# Patient Record
Sex: Female | Born: 1970 | Race: Black or African American | Hispanic: No | Marital: Single | Smoking: Never smoker
Health system: Southern US, Community
[De-identification: ages and names within clinical notes are randomized; demographics above are authoritative.]

## PROBLEM LIST (undated history)

## (undated) DIAGNOSIS — R011 Cardiac murmur, unspecified: Secondary | ICD-10-CM

## (undated) DIAGNOSIS — R200 Anesthesia of skin: Secondary | ICD-10-CM

## (undated) DIAGNOSIS — M1990 Unspecified osteoarthritis, unspecified site: Secondary | ICD-10-CM

## (undated) DIAGNOSIS — D649 Anemia, unspecified: Secondary | ICD-10-CM

## (undated) DIAGNOSIS — R202 Paresthesia of skin: Secondary | ICD-10-CM

## (undated) HISTORY — PX: WISDOM TOOTH EXTRACTION: SHX21

---

## 1998-04-03 ENCOUNTER — Emergency Department (HOSPITAL_COMMUNITY): Admission: EM | Admit: 1998-04-03 | Discharge: 1998-04-03 | Payer: Self-pay | Admitting: Emergency Medicine

## 1999-12-28 ENCOUNTER — Emergency Department (HOSPITAL_COMMUNITY): Admission: EM | Admit: 1999-12-28 | Discharge: 1999-12-28 | Payer: Self-pay | Admitting: Emergency Medicine

## 2010-08-15 ENCOUNTER — Other Ambulatory Visit (HOSPITAL_COMMUNITY): Admission: RE | Admit: 2010-08-15 | Payer: BC Managed Care – PPO | Source: Ambulatory Visit | Admitting: Family Medicine

## 2010-08-15 DIAGNOSIS — Z01419 Encounter for gynecological examination (general) (routine) without abnormal findings: Secondary | ICD-10-CM | POA: Insufficient documentation

## 2011-08-19 ENCOUNTER — Other Ambulatory Visit (HOSPITAL_COMMUNITY)
Admission: RE | Admit: 2011-08-19 | Discharge: 2011-08-19 | Disposition: A | Discharge status: Home / Self Care | Payer: BC Managed Care – PPO | Source: Ambulatory Visit | Attending: Family Medicine | Admitting: Family Medicine

## 2011-08-19 DIAGNOSIS — Z124 Encounter for screening for malignant neoplasm of cervix: Secondary | ICD-10-CM | POA: Insufficient documentation

## 2012-09-14 ENCOUNTER — Other Ambulatory Visit (HOSPITAL_COMMUNITY)
Admission: RE | Admit: 2012-09-14 | Discharge: 2012-09-14 | Disposition: A | Discharge status: Home / Self Care | Payer: BC Managed Care – PPO | Source: Ambulatory Visit | Attending: Family Medicine | Admitting: Family Medicine

## 2012-09-14 ENCOUNTER — Other Ambulatory Visit: Payer: Self-pay | Admitting: Physician Assistant

## 2012-09-14 DIAGNOSIS — Z124 Encounter for screening for malignant neoplasm of cervix: Secondary | ICD-10-CM | POA: Insufficient documentation

## 2013-09-15 ENCOUNTER — Other Ambulatory Visit (HOSPITAL_COMMUNITY)
Admission: RE | Admit: 2013-09-15 | Discharge: 2013-09-15 | Disposition: A | Discharge status: Home / Self Care | Payer: BC Managed Care – PPO | Source: Ambulatory Visit | Attending: Family Medicine | Admitting: Family Medicine

## 2013-09-15 ENCOUNTER — Other Ambulatory Visit: Payer: Self-pay | Admitting: Physician Assistant

## 2013-09-15 DIAGNOSIS — Z124 Encounter for screening for malignant neoplasm of cervix: Secondary | ICD-10-CM | POA: Insufficient documentation

## 2015-02-15 ENCOUNTER — Other Ambulatory Visit: Payer: Self-pay | Admitting: Radiology

## 2015-09-25 ENCOUNTER — Other Ambulatory Visit: Payer: Self-pay | Admitting: Physician Assistant

## 2015-09-25 ENCOUNTER — Other Ambulatory Visit (HOSPITAL_COMMUNITY)
Admission: RE | Admit: 2015-09-25 | Discharge: 2015-09-25 | Disposition: A | Discharge status: Home / Self Care | Payer: BC Managed Care – PPO | Source: Ambulatory Visit | Attending: Family Medicine | Admitting: Family Medicine

## 2015-09-25 DIAGNOSIS — Z124 Encounter for screening for malignant neoplasm of cervix: Secondary | ICD-10-CM | POA: Insufficient documentation

## 2015-09-26 LAB — CYTOLOGY - PAP

## 2016-08-11 ENCOUNTER — Other Ambulatory Visit: Payer: Self-pay | Admitting: Physician Assistant

## 2016-08-11 DIAGNOSIS — R109 Unspecified abdominal pain: Secondary | ICD-10-CM

## 2016-08-13 ENCOUNTER — Ambulatory Visit
Admission: RE | Admit: 2016-08-13 | Discharge: 2016-08-13 | Disposition: A | Discharge status: Home / Self Care | Payer: BC Managed Care – PPO | Source: Ambulatory Visit | Attending: Physician Assistant | Admitting: Physician Assistant

## 2016-08-13 DIAGNOSIS — R109 Unspecified abdominal pain: Secondary | ICD-10-CM

## 2016-08-13 MED ORDER — IOPAMIDOL (ISOVUE-300) INJECTION 61%
100.0000 mL | Freq: Once | INTRAVENOUS | Status: AC | PRN
  Administered 2016-08-13: 100 mL via INTRAVENOUS

## 2016-08-18 NOTE — H&P (Signed)
46yo G2P2 who presents for removal of perforated IUD by hysteroscopy, possibl laparoscopy.  In review, she had the device placed on 10/25/2015.  At that time, the patient did have some significant pelvic pain; however, it seemed to resolve by her string check the following few weeks and an US and showed that the device appeared in the proper position. Since that time she has had intermittent pain, but this past week she noted worsening pain on her right side. Pain is sharp and worse when sitting for long periods of time. She has tried OTC medication with minimal improvement, but rather the pain has not completely resolved. She has noted some nausea, no vomiting. With device, she had not had a period and denies any abnormal bleeding. Denies abnormal discharge, itching or irritation.  Pt was then seen by her PCP who performed a CT, which shows that a portion of the IUD arm is outside of the uterine cavity.      ROS:  CONSTITUTIONAL:  no Chills. no Fever. no Night sweats.  HEENT:  Blurrred vision no. no Double vision.  CARDIOLOGY:  no Chest pain.  RESPIRATORY:  no Shortness of breath. no Cough.  UROLOGY:  no Urinary frequency. no Urinary incontinence. no Urinary urgency.  GASTROENTEROLOGY:  Abdominal pain yes, see HPI. no Appetite change. no Change in bowel movements.  FEMALE REPRODUCTIVE:  no Breast lumps or discharge. no Breast pain.  NEUROLOGY:  no Dizziness. no Headache. no Loss of consciousness.  PSYCHOLOGY:  no Anxiety. no Depression.  SKIN:  no Rash. no Hives.  HEMATOLOGY/LYMPH:  no Anemia. no Fatigue. Using Blood Thinners no.         Medical History: none       Gyn History:  Sexual activity currently sexually active.  Periods : irregular.  Birth control Mirena IUD placed 10/18/15.  Last pap smear date 09/25/15 - WNL.  Last mammogram date 02/15/15, 08/08/15 breast u/s.  Denies H/O Abnormal pap smear.  Denies H/O STD.        OB History:  Number of pregnancies 2.  Pregnancy #  1 live birth, vaginal delivery.  Pregnancy # 2 live birth, vaginal delivery.        Surgical History: Denies Past Surgical History.        Hospitalization/Major Diagnostic Procedure: Childbirth x 2 via vaginal .        Family History: Father: alive. Mother: alive 6264 yrs, diagnosed with Hypertension. Paternal Grand Father: deceased. Paternal Grand Mother: alive. Maternal Grand Father: deceased. Maternal Grand Mother: deceased, AODM. Brother 1: alive. Son(s): alive, 2 boys. 1 brother(s) - healthy. 2 son(s) . .  denies family h/o gyn cancers.       Social History:  General:  Tobacco use  cigarettes: Never smoked Tobacco history last updated 08/15/2016 no EXPOSURE TO PASSIVE SMOKE.  Alcohol: yes, 1 glass of wine per week or less.  Caffeine: yes, 2 per week.  Exercise: nothing structured.  DENTAL CARE: good.  Marital Status: single.  Children: 2, son (s).  OCCUPATION: employed, [employer] managerprogram assistant.  COMMUNICATION BARRIERS: none.  Lives with self and son.       Medications: Taking Mirena 20 MCG/24HR Intrauterine Device as directed Intrauterine , Medication List reviewed and reconciled with the patient       Allergies: N.K.D.A.   O: Performed in office   Vitals: Wt 193, Wt change -1 lb, Pulse sitting 78, BP sitting 117/76.       Examination:  General Examination:  GENERAL APPEARANCE well developed, well nourished .  SKIN: warm and dry, no rashes .  NECK: supple, normal appearance .  LUNGS: regular breathing rate and effort .  HEART: no murmurs, regular rate and rhythm.  ABDOMEN: soft, +RLQ tenderness, no rebound, no guarding.  FEMALE GENITOURINARY: normal external genitalia, labia - unremarkable, vagina - pink moist mucosa, no lesions or abnormal discharge, cervix - no discharge or lesions or CMT, strings visualized at os.  MUSCULOSKELETAL no calf tenderness bilaterally .  EXTREMITIES: no edema present .  PSYCH: appropriate mood and affect .     A/P: 46yo G2P2 who  presents for hysteroscopy possible laparoscopy for removal of misplaced IUD -NPO -LR @ 125cc/hr -SCDs to OR -Ancef 2g IV -Risk/benefit discussed with patient including but not limited to risk of bleeding, infection and injury to surrounding organs.  Questions were addressed and pt wishes to proceed.  Myna Hidalgo, DO [phone] (pager) [phone] (office)

## 2016-08-19 ENCOUNTER — Encounter (HOSPITAL_COMMUNITY): Payer: Self-pay

## 2016-08-20 ENCOUNTER — Ambulatory Visit (HOSPITAL_COMMUNITY): Payer: BC Managed Care – PPO | Admitting: Certified Registered"

## 2016-08-20 ENCOUNTER — Encounter (HOSPITAL_COMMUNITY)
Admission: RE | Disposition: A | Discharge status: Home / Self Care | Payer: Self-pay | Source: Ambulatory Visit | Attending: Obstetrics & Gynecology

## 2016-08-20 ENCOUNTER — Encounter (HOSPITAL_COMMUNITY): Payer: Self-pay

## 2016-08-20 ENCOUNTER — Ambulatory Visit (HOSPITAL_COMMUNITY)
Admission: RE | Admit: 2016-08-20 | Discharge: 2016-08-20 | Disposition: A | Discharge status: Home / Self Care | Payer: BC Managed Care – PPO | Source: Ambulatory Visit | Attending: Obstetrics & Gynecology | Admitting: Obstetrics & Gynecology

## 2016-08-20 DIAGNOSIS — T8389XD Other specified complication of genitourinary prosthetic devices, implants and grafts, subsequent encounter: Secondary | ICD-10-CM

## 2016-08-20 DIAGNOSIS — Z79899 Other long term (current) drug therapy: Secondary | ICD-10-CM | POA: Insufficient documentation

## 2016-08-20 DIAGNOSIS — T8332XD Displacement of intrauterine contraceptive device, subsequent encounter: Secondary | ICD-10-CM

## 2016-08-20 DIAGNOSIS — R102 Pelvic and perineal pain: Secondary | ICD-10-CM

## 2016-08-20 DIAGNOSIS — Y762 Prosthetic and other implants, materials and accessory obstetric and gynecological devices associated with adverse incidents: Secondary | ICD-10-CM | POA: Insufficient documentation

## 2016-08-20 DIAGNOSIS — T8389XA Other specified complication of genitourinary prosthetic devices, implants and grafts, initial encounter: Secondary | ICD-10-CM

## 2016-08-20 DIAGNOSIS — T8332XA Displacement of intrauterine contraceptive device, initial encounter: Secondary | ICD-10-CM | POA: Insufficient documentation

## 2016-08-20 HISTORY — DX: Anesthesia of skin: R20.0

## 2016-08-20 HISTORY — DX: Anemia, unspecified: D64.9

## 2016-08-20 HISTORY — DX: Paresthesia of skin: R20.2

## 2016-08-20 HISTORY — DX: Unspecified osteoarthritis, unspecified site: M19.90

## 2016-08-20 HISTORY — PX: LAPAROSCOPY: SHX197

## 2016-08-20 HISTORY — DX: Cardiac murmur, unspecified: R01.1

## 2016-08-20 LAB — CBC
HEMATOCRIT: 36 % (ref 36.0–46.0)
Hemoglobin: 12.1 g/dL (ref 12.0–15.0)
MCH: 28.4 pg (ref 26.0–34.0)
MCHC: 33.6 g/dL (ref 30.0–36.0)
MCV: 84.5 fL (ref 78.0–100.0)
Platelets: 237 10*3/uL (ref 150–400)
RBC: 4.26 MIL/uL (ref 3.87–5.11)
RDW: 12.9 % (ref 11.5–15.5)
WBC: 6 10*3/uL (ref 4.0–10.5)

## 2016-08-20 LAB — TYPE AND SCREEN
ABO/RH(D): O POS
Antibody Screen: NEGATIVE

## 2016-08-20 LAB — ABO/RH: ABO/RH(D): O POS

## 2016-08-20 SURGERY — LAPAROSCOPY, DIAGNOSTIC
Anesthesia: General | Site: Abdomen

## 2016-08-20 MED ORDER — ACETAMINOPHEN 160 MG/5ML PO SOLN
ORAL | Status: AC
  Administered 2016-08-20: 975 mg via ORAL
  Filled 2016-08-20: qty 40.6

## 2016-08-20 MED ORDER — SUGAMMADEX SODIUM 200 MG/2ML IV SOLN
INTRAVENOUS | Status: DC | PRN
  Administered 2016-08-20: 200 mg via INTRAVENOUS

## 2016-08-20 MED ORDER — MIDAZOLAM HCL 2 MG/2ML IJ SOLN
INTRAMUSCULAR | Status: DC | PRN
  Administered 2016-08-20: 2 mg via INTRAVENOUS

## 2016-08-20 MED ORDER — SCOPOLAMINE 1 MG/3DAYS TD PT72
MEDICATED_PATCH | TRANSDERMAL | Status: AC
  Administered 2016-08-20: 1.5 mg via TRANSDERMAL
  Filled 2016-08-20: qty 1

## 2016-08-20 MED ORDER — MIDAZOLAM HCL 2 MG/2ML IJ SOLN
INTRAMUSCULAR | Status: AC
  Filled 2016-08-20: qty 2

## 2016-08-20 MED ORDER — DEXAMETHASONE SODIUM PHOSPHATE 10 MG/ML IJ SOLN
INTRAMUSCULAR | Status: DC | PRN
  Administered 2016-08-20: 4 mg via INTRAVENOUS

## 2016-08-20 MED ORDER — FENTANYL CITRATE (PF) 100 MCG/2ML IJ SOLN: 25.0000 ug | INTRAMUSCULAR | Status: DC | PRN

## 2016-08-20 MED ORDER — FENTANYL CITRATE (PF) 100 MCG/2ML IJ SOLN
INTRAMUSCULAR | Status: DC | PRN
  Administered 2016-08-20: 50 ug via INTRAVENOUS
  Administered 2016-08-20 (×2): 100 ug via INTRAVENOUS

## 2016-08-20 MED ORDER — LACTATED RINGERS IV SOLN
INTRAVENOUS | Status: DC
  Administered 2016-08-20: 1000 mL via INTRAVENOUS
  Administered 2016-08-20 (×2): via INTRAVENOUS

## 2016-08-20 MED ORDER — FENTANYL CITRATE (PF) 250 MCG/5ML IJ SOLN
INTRAMUSCULAR | Status: AC
  Filled 2016-08-20: qty 5

## 2016-08-20 MED ORDER — ONDANSETRON HCL 4 MG/2ML IJ SOLN
INTRAMUSCULAR | Status: DC | PRN
  Administered 2016-08-20: 4 mg via INTRAVENOUS

## 2016-08-20 MED ORDER — BUPIVACAINE HCL (PF) 0.25 % IJ SOLN
INTRAMUSCULAR | Status: AC
  Filled 2016-08-20: qty 30

## 2016-08-20 MED ORDER — SCOPOLAMINE 1 MG/3DAYS TD PT72
1.0000 | MEDICATED_PATCH | Freq: Once | TRANSDERMAL | Status: DC
  Administered 2016-08-20: 1.5 mg via TRANSDERMAL

## 2016-08-20 MED ORDER — CEFAZOLIN SODIUM-DEXTROSE 2-4 GM/100ML-% IV SOLN
2.0000 g | Freq: Once | INTRAVENOUS | Status: AC
  Administered 2016-08-20: 2 g via INTRAVENOUS

## 2016-08-20 MED ORDER — LACTATED RINGERS IV SOLN: INTRAVENOUS | Status: DC

## 2016-08-20 MED ORDER — BUPIVACAINE HCL (PF) 0.25 % IJ SOLN
INTRAMUSCULAR | Status: DC | PRN
  Administered 2016-08-20: 20 mL

## 2016-08-20 MED ORDER — ACETAMINOPHEN 160 MG/5ML PO SOLN
975.0000 mg | Freq: Four times a day (QID) | ORAL | Status: DC | PRN
  Administered 2016-08-20: 975 mg via ORAL

## 2016-08-20 MED ORDER — KETOROLAC TROMETHAMINE 30 MG/ML IJ SOLN
INTRAMUSCULAR | Status: DC | PRN
  Administered 2016-08-20: 30 mg via INTRAVENOUS

## 2016-08-20 SURGICAL SUPPLY — 43 items
CANISTER SUCT 3000ML (MISCELLANEOUS) IMPLANT
CATH ROBINSON RED A/P 16FR (CATHETERS) ×4 IMPLANT
CLOTH BEACON ORANGE TIMEOUT ST (SAFETY) ×4 IMPLANT
CONTAINER PREFILL 10% NBF 60ML (FORM) IMPLANT
DERMABOND ADVANCED (GAUZE/BANDAGES/DRESSINGS) ×2
DERMABOND ADVANCED .7 DNX12 (GAUZE/BANDAGES/DRESSINGS) ×2 IMPLANT
DEVICE TROCAR PUNCTURE CLOSURE (ENDOMECHANICALS) IMPLANT
DILATOR CANAL MILEX (MISCELLANEOUS) IMPLANT
DRSG OPSITE POSTOP 3X4 (GAUZE/BANDAGES/DRESSINGS) ×4 IMPLANT
DURAPREP 26ML APPLICATOR (WOUND CARE) ×4 IMPLANT
FORCEPS CUTTING 33CM 5MM (CUTTING FORCEPS) IMPLANT
GLOVE BIOGEL PI IND STRL 6.5 (GLOVE) ×4 IMPLANT
GLOVE BIOGEL PI IND STRL 7.0 (GLOVE) ×8 IMPLANT
GLOVE BIOGEL PI INDICATOR 6.5 (GLOVE) ×4
GLOVE BIOGEL PI INDICATOR 7.0 (GLOVE) ×8
GLOVE ECLIPSE 6.5 STRL STRAW (GLOVE) ×4 IMPLANT
GOWN STRL REUS W/TWL LRG LVL3 (GOWN DISPOSABLE) ×8 IMPLANT
NEEDLE INSUFFLATION 120MM (ENDOMECHANICALS) ×4 IMPLANT
PACK LAPAROSCOPY BASIN (CUSTOM PROCEDURE TRAY) ×4 IMPLANT
PACK TRENDGUARD 450 HYBRID PRO (MISCELLANEOUS) ×2 IMPLANT
PACK TRENDGUARD 600 HYBRD PROC (MISCELLANEOUS) IMPLANT
PACK VAGINAL MINOR WOMEN LF (CUSTOM PROCEDURE TRAY) ×4 IMPLANT
PAD OB MATERNITY 4.3X12.25 (PERSONAL CARE ITEMS) ×4 IMPLANT
POUCH SPECIMEN RETRIEVAL 10MM (ENDOMECHANICALS) IMPLANT
PROTECTOR NERVE ULNAR (MISCELLANEOUS) ×4 IMPLANT
SET IRRIG TUBING LAPAROSCOPIC (IRRIGATION / IRRIGATOR) IMPLANT
SHEARS HARMONIC ACE PLUS 36CM (ENDOMECHANICALS) IMPLANT
SLEEVE XCEL OPT CAN 5 100 (ENDOMECHANICALS) IMPLANT
SUT MON AB 4-0 PS1 27 (SUTURE) ×4 IMPLANT
SUT VIC AB 0 CT1 27 (SUTURE)
SUT VIC AB 0 CT1 27XBRD ANBCTR (SUTURE) IMPLANT
SUT VICRYL 0 UR6 27IN ABS (SUTURE) IMPLANT
TOWEL OR 17X24 6PK STRL BLUE (TOWEL DISPOSABLE) ×8 IMPLANT
TRAY FOLEY CATH SILVER 14FR (SET/KITS/TRAYS/PACK) IMPLANT
TRENDGUARD 450 HYBRID PRO PACK (MISCELLANEOUS) ×4
TRENDGUARD 600 HYBRID PROC PK (MISCELLANEOUS)
TROCAR XCEL NON-BLD 11X100MML (ENDOMECHANICALS) ×4 IMPLANT
TROCAR XCEL NON-BLD 5MMX100MML (ENDOMECHANICALS) ×4 IMPLANT
TROCAR XCEL OPT SLVE 5M 100M (ENDOMECHANICALS) ×4 IMPLANT
TUBING AQUILEX INFLOW (TUBING) ×4 IMPLANT
TUBING AQUILEX OUTFLOW (TUBING) ×4 IMPLANT
WARMER LAPAROSCOPE (MISCELLANEOUS) ×4 IMPLANT
WATER STERILE IRR 1000ML POUR (IV SOLUTION) ×4 IMPLANT

## 2016-08-20 NOTE — Transfer of Care (Signed)
Immediate Anesthesia Transfer of Care Note  Patient: Brenda Townsend  Procedure(s) Performed: Procedure(s): LAPAROSCOPY DIAGNOSTIC, LAPAROSCOPIC REMOVAL OF IUD (N/A)  Patient Location: PACU  Anesthesia Type:General  Level of Consciousness: awake, alert  and oriented  Airway & Oxygen Therapy: Patient Spontanous Breathing and Patient connected to nasal cannula oxygen  Post-op Assessment: Report given to RN and Post -op Vital signs reviewed and stable  Post vital signs: Reviewed and stable  Last Vitals:  Vitals:   08/20/16 1333  BP: 115/86  Pulse: 73  Resp: 16  Temp: 36.7 C    Last Pain:  Vitals:   08/20/16 1333  TempSrc: Oral      Patients Stated Pain Goal: 2 (08/20/16 1333)  Complications: No apparent anesthesia complications

## 2016-08-20 NOTE — Discharge Instructions (Addendum)
Hysteroscopy °Hysteroscopy is a procedure used for looking inside the womb (uterus). It may be done for various reasons, including: °· To evaluate abnormal bleeding, fibroid (benign, noncancerous) tumors, polyps, scar tissue (adhesions), and possibly cancer of the uterus. °· To look for lumps (tumors) and other uterine growths. °· To look for causes of why a woman cannot get pregnant (infertility), causes of recurrent loss of pregnancy (miscarriages), or a lost intrauterine device (IUD). °· To perform a sterilization by blocking the fallopian tubes from inside the uterus. °In this procedure, a thin, flexible tube with a tiny light and camera on the end of it (hysteroscope) is used to look inside the uterus. A hysteroscopy should be done right after a menstrual period to be sure you are not pregnant. °LET YOUR HEALTH CARE PROVIDER KNOW ABOUT:  °· Any allergies you have. °· All medicines you are taking, including vitamins, herbs, eye drops, creams, and over-the-counter medicines. °· Previous problems you or members of your family have had with the use of anesthetics. °· Any blood disorders you have. °· Previous surgeries you have had. °· Medical conditions you have. °RISKS AND COMPLICATIONS  °Generally, this is a safe procedure. However, as with any procedure, complications can occur. Possible complications include: °· Putting a hole in the uterus. °· Excessive bleeding. °· Infection. °· Damage to the cervix. °· Injury to other organs. °· Allergic reaction to medicines. °· Too much fluid used in the uterus for the procedure. °BEFORE THE PROCEDURE  °· Ask your health care provider about changing or stopping any regular medicines. °· Do not take aspirin or blood thinners for 1 week before the procedure, or as directed by your health care provider. These can cause bleeding. °· If you smoke, do not smoke for 2 weeks before the procedure. °· In some cases, a medicine is placed in the cervix the day before the procedure.  This medicine makes the cervix have a larger opening (dilate). This makes it easier for the instrument to be inserted into the uterus during the procedure. °· Do not eat or drink anything for at least 8 hours before the surgery. °· Arrange for someone to take you home after the procedure. °PROCEDURE  °· You may be given a medicine to relax you (sedative). You may also be given one of the following: °¨ A medicine that numbs the area around the cervix (local anesthetic). °¨ A medicine that makes you sleep through the procedure (general anesthetic). °· The hysteroscope is inserted through the vagina into the uterus. The camera on the hysteroscope sends a picture to a TV screen. This gives the surgeon a good view inside the uterus. °· During the procedure, air or a liquid is put into the uterus, which allows the surgeon to see better. °· Sometimes, tissue is gently scraped from inside the uterus. These tissue samples are sent to a lab for testing. °AFTER THE PROCEDURE  °· If you had a general anesthetic, you may be groggy for a couple hours after the procedure. °· If you had a local anesthetic, you will be able to go home as soon as you are stable and feel ready. °· You may have some cramping. This normally lasts for a couple days. °· You may have bleeding, which varies from light spotting for a few days to menstrual-like bleeding for 3-7 days. This is normal. °· If your test results are not back during the visit, make an appointment with your health care provider to find out the   results. °This information is not intended to replace advice given to you by your health care provider. Make sure you discuss any questions you have with your health care provider. °Document Released: 09/29/2000 Document Revised: 04/13/2013 Document Reviewed: 01/20/2013 °Elsevier Interactive Patient Education © 2017 [employer] ° °

## 2016-08-20 NOTE — Anesthesia Procedure Notes (Signed)
Procedure Name: Intubation Date/Time: 08/20/2016 5:26 PM Performed by: Jonna Munro Pre-anesthesia Checklist: Patient identified, Emergency Drugs available, Suction available, Patient being monitored and Timeout performed Patient Re-evaluated:Patient Re-evaluated prior to inductionOxygen Delivery Method: Circle system utilized Preoxygenation: Pre-oxygenation with 100% oxygen Intubation Type: IV induction Ventilation: Mask ventilation without difficulty Laryngoscope Size: Mac and 3 Grade View: Grade I Tube type: Oral Tube size: 7.0 mm Number of attempts: 1 Airway Equipment and Method: Stylet Placement Confirmation: ETT inserted through vocal cords under direct vision,  positive ETCO2 and breath sounds checked- equal and bilateral Secured at: 22 cm Tube secured with: Tape Dental Injury: Teeth and Oropharynx as per pre-operative assessment

## 2016-08-20 NOTE — Progress Notes (Deleted)
PREOPERATIVE DIAGNOSIS:  IUD migration, pelvic pain POSTOPERATIVE DIAGNOSIS: same PROCEDURE PERFORMED: Diagnostic laparoscopy, removal of IUD SURGEON: Dr. Myna HidalgoJennifer Johnnetta Holstine ASSISTANT:Dr. Marylou FlesherBenita Varnado ANESTHESIA: General endotracheal.  ESTIMATED BLOOD LOSS: 10cc.  URINE OUTPUT: 50cc of clear yellow urine at the end of the procedure.  IV FLUIDS: 1000cc of crystalloid.  SPECIMEN(S): none COMPLICATIONS: None.  CONDITION: Stable.  FINDINGS: No ascites or peritoneal studding was appreciated.  Liver, gallbladder and bowel appeared grossly normal.  Near left cornua of uterus, both IUD arms were visualized outside of the uterus- no bleeding was noted. Normal tubes and ovaries bilaterally.  Informed consent was obtained from the patient prior to taking her to the operating room where anesthesia was found to be adequate. She was placed in dorsal lithotomy position and examined under anesthesia. She was prepped and draped in normal sterile fashion. The bladder was catheterized with a red rubber under sterile technique. A sponge stick was placed.  Attention was turned to the patients abdomen where a 5 mm infraumbilical skin incision was made with the scalpel. The veress needle was carefully introduced into the peritoneal cavity while tenting the abdominal wall. Intraperitoneal placement was confirmed by use of a saline-drop test.  The gas was connected and confirmed intrabdominal placement by a low initial pressure of 5mmHg. The abdomen was then insuflated with CO2 gas. The trocar and sleeve were then advanced without difficulty into the abdomen under direct visualization. Intraabdominal placement was confirmed by the laparoscope and surveillance of the abdomen was performed. Grossly normal appearing abdomen with the findings as mentioned above.  An additional port was placed in the left lower quadrant.  In a similar fashion the area was injected with local anesthetic and trocar placed under direct visualization.  A  grasper was used to removed the IUD in its entirety.  Slight bleeding was noted and coagulated.  Excellent hemostasis was noted. The instruments were then removed from the patients abdomen with air allowed to fully escape. The port sites were then closed with monocryl. The manipulator was removed from the cervix with no lacerations or bleeding identified. The patient tolerated the procedure well with all sponge, lap, and needle counts correct. The patient was taken to recovery in stable condition.  Dr. Dion BodyVarnado was present as no residents are available to the service and due to concern for concomitant laparoscopic and vaginal surgery.  Myna HidalgoJennifer Buelah Rennie, DO [phone] (pager) [phone] (office)

## 2016-08-20 NOTE — Anesthesia Postprocedure Evaluation (Signed)
Anesthesia Post Note  Patient: Brenda Townsend  Procedure(s) Performed: Procedure(s) (LRB): LAPAROSCOPY DIAGNOSTIC, LAPAROSCOPIC REMOVAL OF IUD (N/A)  Patient location during evaluation: PACU Anesthesia Type: General Level of consciousness: awake and alert and patient cooperative Pain management: pain level controlled Vital Signs Assessment: post-procedure vital signs reviewed and stable Respiratory status: spontaneous breathing and respiratory function stable Cardiovascular status: stable Anesthetic complications: no        Last Vitals:  Vitals:   08/20/16 1900 08/20/16 1915  BP:    Pulse:    Resp: 16 16  Temp: 36.7 C     Last Pain:  Vitals:   08/20/16 1333  TempSrc: Oral   Pain Goal: Patients Stated Pain Goal: 2 (08/20/16 1915)               Taylinn Brabant S

## 2016-08-20 NOTE — Interval H&P Note (Signed)
History and Physical Interval Note:  08/20/2016 4:24 PM  Brenda Townsend  has presented today for surgery, with the diagnosis of Misplaced IUD  The various methods of treatment have been discussed with the patient and family. After consideration of risks, benefits and other options for treatment, the patient has consented to  Procedure(s): HYSTEROSCOPY (N/A) LAPAROSCOPY DIAGNOSTIC (N/A) with removal of IUD as a surgical intervention .  The patient's history has been reviewed, patient examined, no change in status, stable for surgery.  I have reviewed the patient's chart and labs.  Questions were answered to the patient's satisfaction.     Myna HidalgoZAN, Andrena Margerum, M

## 2016-08-20 NOTE — Anesthesia Preprocedure Evaluation (Signed)
Anesthesia Evaluation  Patient identified by MRN, date of birth, ID band Patient awake    Reviewed: Allergy & Precautions, H&P , Patient's Chart, lab work & pertinent test results, reviewed documented beta blocker date and time   Airway Mallampati: II  TM Distance: >3 FB Neck ROM: full    Dental no notable dental hx.    Pulmonary    Pulmonary exam normal breath sounds clear to auscultation       Cardiovascular  Rhythm:regular Rate:Normal     Neuro/Psych    GI/Hepatic   Endo/Other    Renal/GU      Musculoskeletal   Abdominal   Peds  Hematology   Anesthesia Other Findings   Reproductive/Obstetrics                             Anesthesia Physical Anesthesia Plan  ASA: II  Anesthesia Plan: General   Post-op Pain Management:    Induction: Intravenous  Airway Management Planned: Oral ETT  Additional Equipment:   Intra-op Plan:   Post-operative Plan: Extubation in OR  Informed Consent: I have reviewed the patients History and Physical, chart, labs and discussed the procedure including the risks, benefits and alternatives for the proposed anesthesia with the patient or authorized representative who has indicated his/her understanding and acceptance.   Dental Advisory Given and Dental advisory given  Plan Discussed with: CRNA and Surgeon  Anesthesia Plan Comments: (  Discussed general anesthesia, including possible nausea, instrumentation of airway, sore throat,pulmonary aspiration, etc. I asked if the were any outstanding questions, or  concerns before we proceeded.)        Anesthesia Quick Evaluation  

## 2016-08-21 ENCOUNTER — Encounter (HOSPITAL_COMMUNITY): Payer: Self-pay | Admitting: Obstetrics & Gynecology

## 2016-08-26 NOTE — Op Note (Signed)
PREOPERATIVE DIAGNOSIS:  IUD migration, pelvic pain POSTOPERATIVE DIAGNOSIS: same PROCEDURE PERFORMED: Diagnostic laparoscopy, removal of IUD SURGEON: Dr. Rikayla Demmon ASSISTANT:Dr. Benita Varnado ANESTHESIA: General endotracheal.  ESTIMATED BLOOD LOSS: 10cc.  URINE OUTPUT: 50cc of clear yellow urine at the end of the procedure.  IV FLUIDS: 1000cc of crystalloid.  SPECIMEN(S): none COMPLICATIONS: None.  CONDITION: Stable.  FINDINGS: No ascites or peritoneal studding was appreciated.  Liver, gallbladder and bowel appeared grossly normal.  Near left cornua of uterus, both IUD arms were visualized outside of the uterus- no bleeding was noted. Normal tubes and ovaries bilaterally.  Informed consent was obtained from the patient prior to taking her to the operating room where anesthesia was found to be adequate. She was placed in dorsal lithotomy position and examined under anesthesia. She was prepped and draped in normal sterile fashion. The bladder was catheterized with a red rubber under sterile technique. A sponge stick was placed.  Attention was turned to the patients abdomen where a 5 mm infraumbilical skin incision was made with the scalpel. The veress needle was carefully introduced into the peritoneal cavity while tenting the abdominal wall. Intraperitoneal placement was confirmed by use of a saline-drop test.  The gas was connected and confirmed intrabdominal placement by a low initial pressure of 5mmHg. The abdomen was then insuflated with CO2 gas. The trocar and sleeve were then advanced without difficulty into the abdomen under direct visualization. Intraabdominal placement was confirmed by the laparoscope and surveillance of the abdomen was performed. Grossly normal appearing abdomen with the findings as mentioned above.  An additional port was placed in the left lower quadrant.  In a similar fashion the area was injected with local anesthetic and trocar placed under direct visualization.  A  grasper was used to removed the IUD in its entirety.  Slight bleeding was noted and coagulated.  Excellent hemostasis was noted. The instruments were then removed from the patients abdomen with air allowed to fully escape. The port sites were then closed with monocryl. The manipulator was removed from the cervix with no lacerations or bleeding identified. The patient tolerated the procedure well with all sponge, lap, and needle counts correct. The patient was taken to recovery in stable condition.  Dr. Varnado was present as no residents are available to the service and due to concern for concomitant laparoscopic and vaginal surgery.  Brenda Corman, DO [phone] (pager) [phone] (office)    

## 2017-07-01 IMAGING — CT CT PELVIS W/ CM
1 series · 15 of 32 positions shown, 19 images · IV contrast (APPLIED)
Comparison: None.

CLINICAL DATA: RIGHT flank pain, RIGHT groin swelling for 5 days.

EXAM:
CT PELVIS WITH CONTRAST
TECHNIQUE: Multidetector CT imaging of the pelvis was performed using the
standard protocol following the bolus administration of intravenous
contrast.
CONTRAST:  100mL [id number] IOPAMIDOL (ML8SQK-[id number]) INJECTION 61%

[Series 2: routine pelvis w/cm · axial · 0.93mm/px · z∈[-676,-401]mm · 15 of 61 slices shown, 19 images]
[im 4/61  soft-tissue]
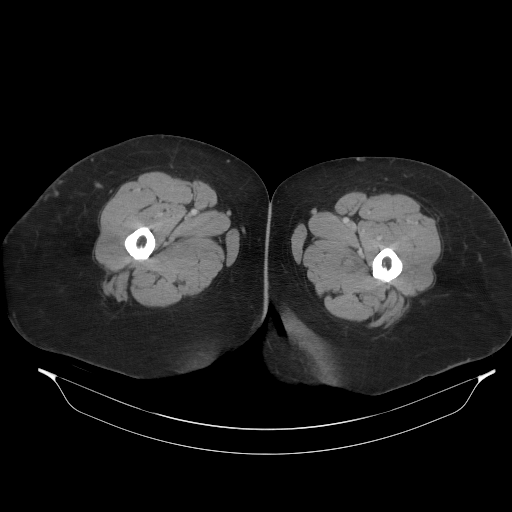
[im 4/61  bone]
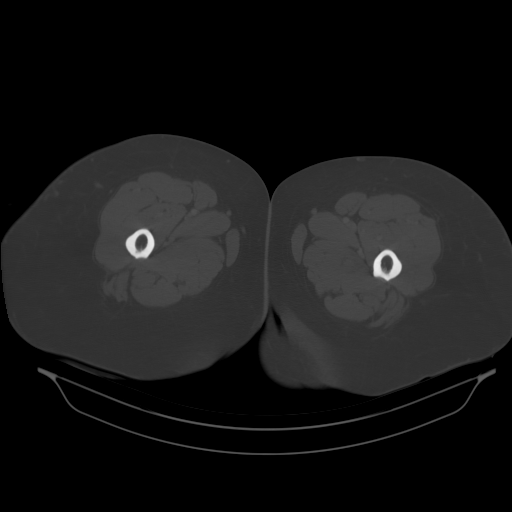
[im 8/61  soft-tissue]
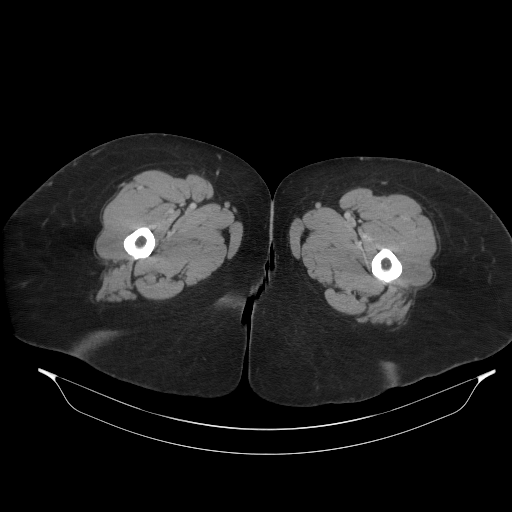
[im 12/61  soft-tissue]
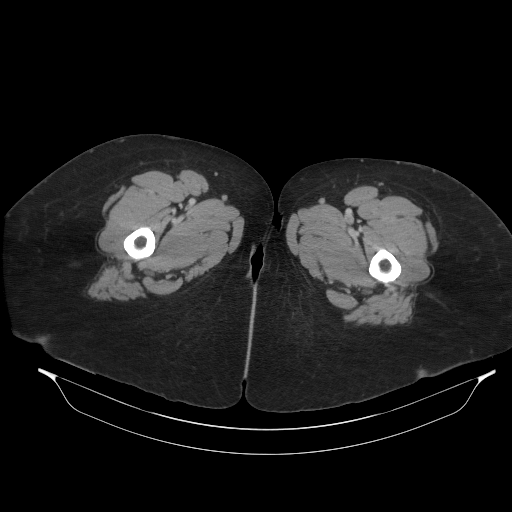
[im 18/61  soft-tissue]
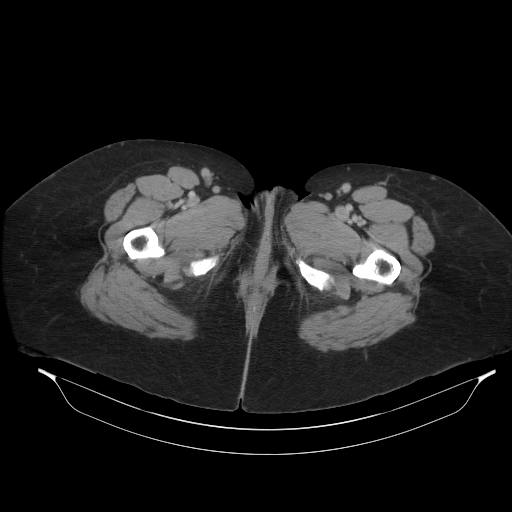
[im 22/61  soft-tissue]
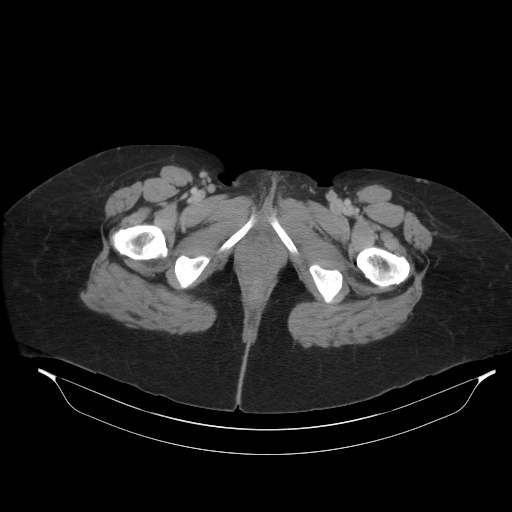
[im 26/61  soft-tissue]
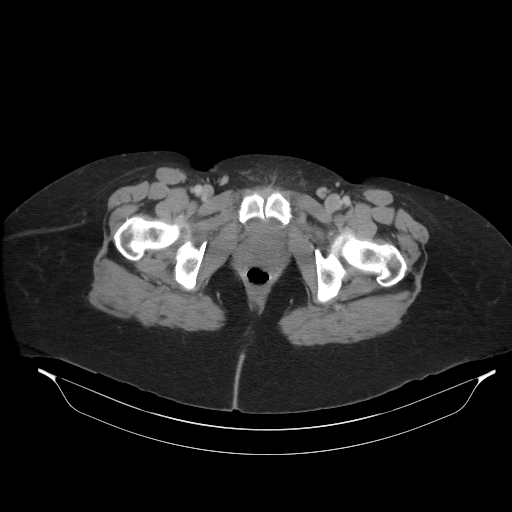
[im 31/61  soft-tissue]
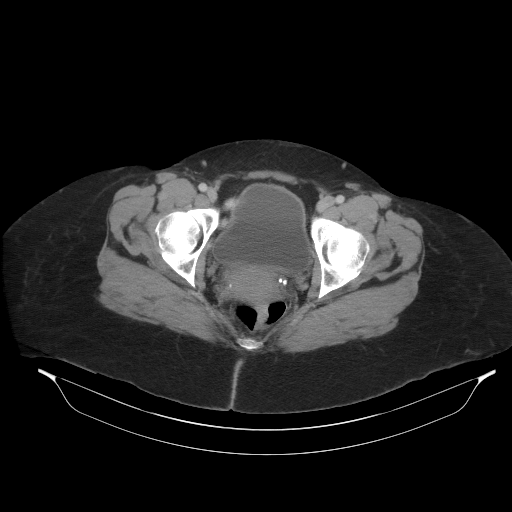
[im 35/61  soft-tissue]
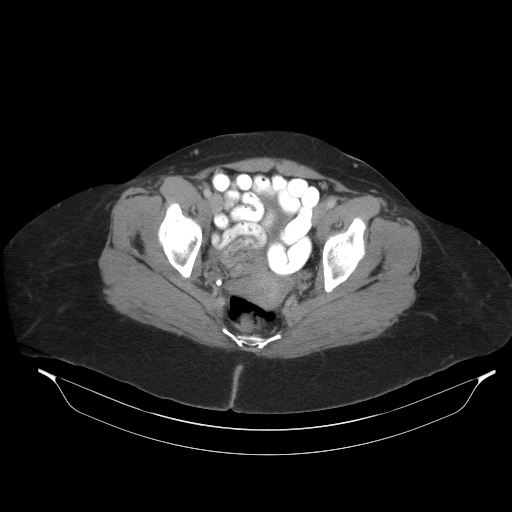
[im 39/61  soft-tissue]
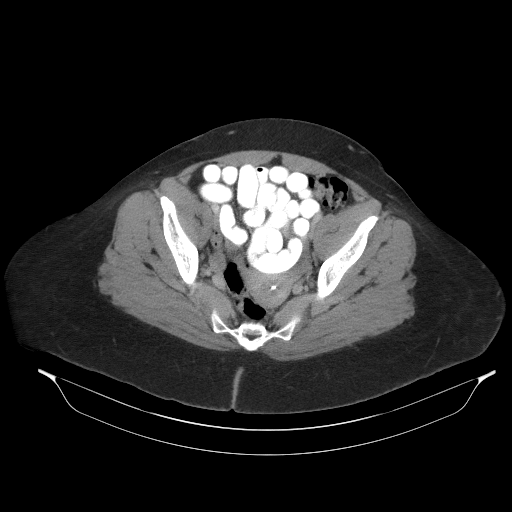
[im 39/61  bone]
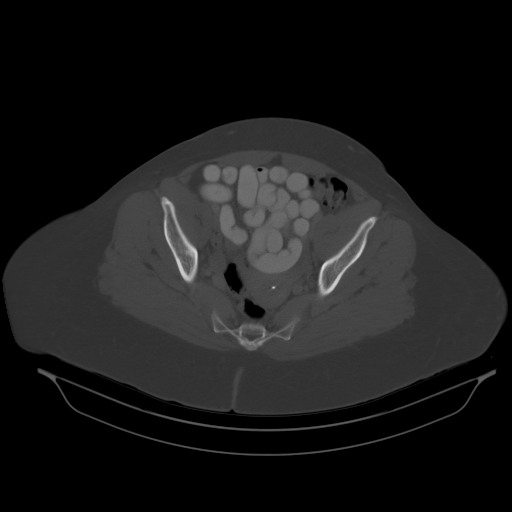
[im 43/61  soft-tissue]
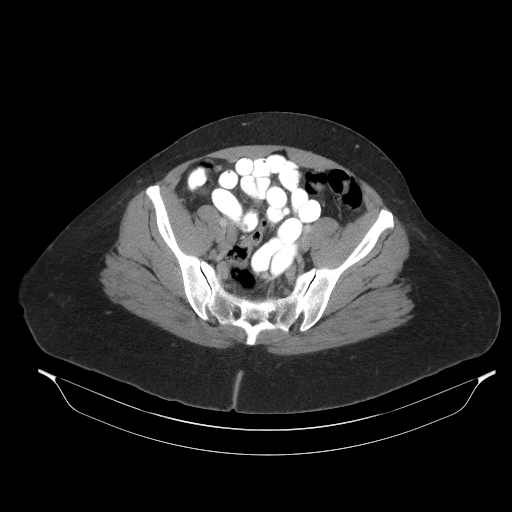
[im 49/61  soft-tissue]
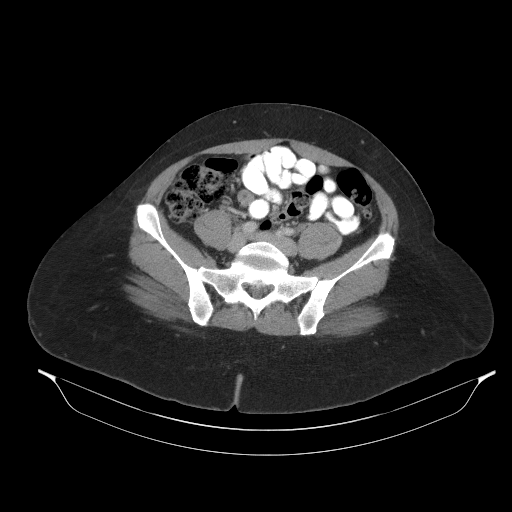
[im 53/61  soft-tissue]
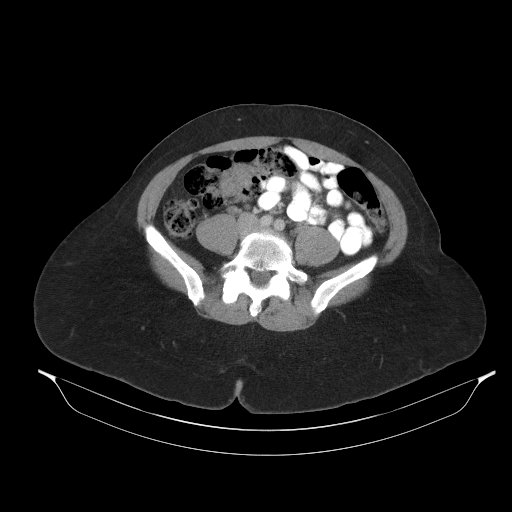
[im 53/61  lung]
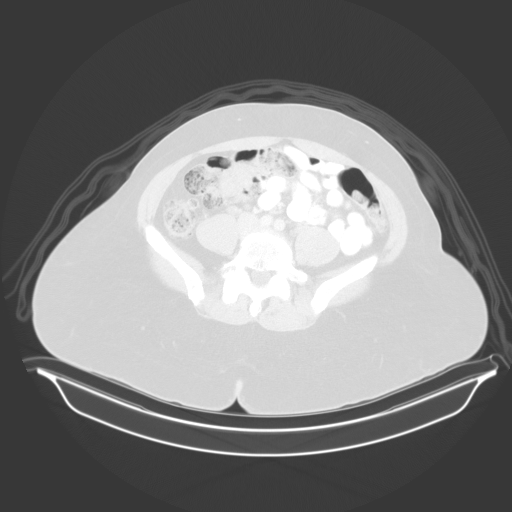
[im 55/61  lung]
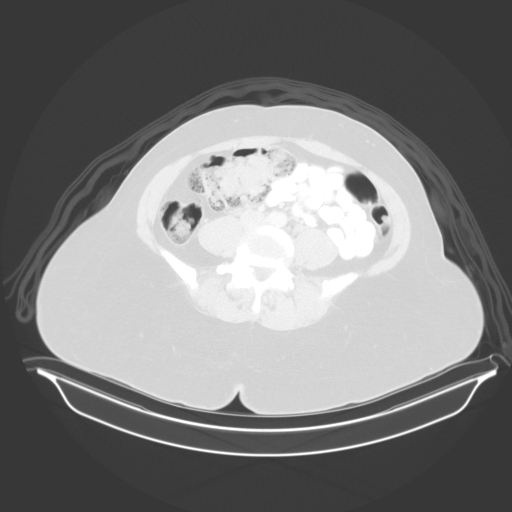
[im 57/61  soft-tissue]
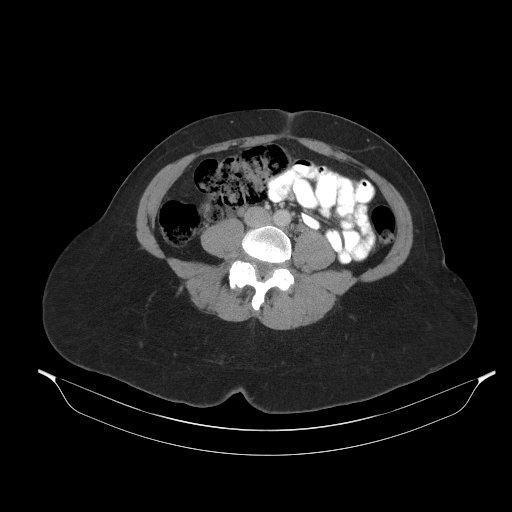
[im 57/61  lung]
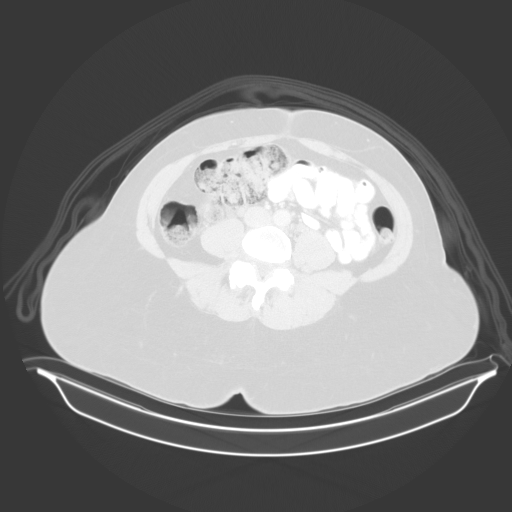
[im 59/61  lung]
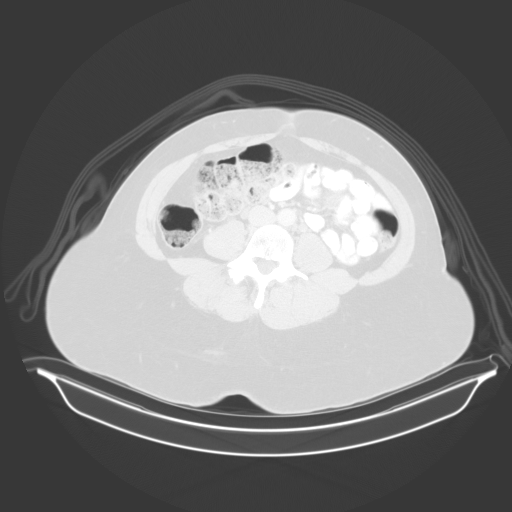

[15 of 32 positions shown; findings below may reference images not displayed]

FINDINGS: Urinary Tract: Urinary bladder is well distended. No intravesicular
calculi.

Bowel: Contrast in the small bowel. Moderate colonic diverticulosis.
Normal appendix.

Vascular/Lymphatic: Phleboliths in the pelvis. No intraperitoneal
free fluid or free air.

Reproductive: Superiorly migrated IUD, prongs are outside the
uterine fundus.

Other: Subcentimeter benign-appearing inguinal lymph nodes. No focal
fluid collection, subcutaneous gas or radiopaque foreign bodies with
particular attention to the RIGHT groin.

Musculoskeletal: Severe severe RIGHT L4-5 and L5-S1 facet
arthropathy, moderate on the LEFT. Subcentimeter probable bone
island RIGHT iliac.
IMPRESSION: No acute pelvic process, with particular attention to the RIGHT
groin.

IUD perforation without migration.

Severe RIGHT L4-5 and L5-S1 facet arthropathy.

## 2017-08-04 ENCOUNTER — Ambulatory Visit
Admission: RE | Admit: 2017-08-04 | Discharge: 2017-08-04 | Disposition: A | Discharge status: Home / Self Care | Payer: BC Managed Care – PPO | Source: Ambulatory Visit | Attending: Physician Assistant | Admitting: Physician Assistant

## 2017-08-04 ENCOUNTER — Other Ambulatory Visit: Payer: Self-pay | Admitting: Physician Assistant

## 2017-08-04 DIAGNOSIS — R52 Pain, unspecified: Secondary | ICD-10-CM

## 2017-08-19 ENCOUNTER — Ambulatory Visit: Payer: BC Managed Care – PPO

## 2017-08-19 ENCOUNTER — Ambulatory Visit: Payer: BC Managed Care – PPO | Admitting: Podiatry

## 2017-08-19 ENCOUNTER — Other Ambulatory Visit: Payer: Self-pay | Admitting: Podiatry

## 2017-08-19 ENCOUNTER — Encounter: Payer: Self-pay | Admitting: Podiatry

## 2017-08-19 ENCOUNTER — Ambulatory Visit (INDEPENDENT_AMBULATORY_CARE_PROVIDER_SITE_OTHER): Payer: BC Managed Care – PPO

## 2017-08-19 VITALS — BP 116/84 | HR 82 | Resp 16

## 2017-08-19 DIAGNOSIS — M21619 Bunion of unspecified foot: Secondary | ICD-10-CM | POA: Diagnosis not present

## 2017-08-19 DIAGNOSIS — M79671 Pain in right foot: Secondary | ICD-10-CM

## 2017-08-19 DIAGNOSIS — M7752 Other enthesopathy of left foot: Secondary | ICD-10-CM | POA: Diagnosis not present

## 2017-08-19 DIAGNOSIS — M2042 Other hammer toe(s) (acquired), left foot: Secondary | ICD-10-CM | POA: Diagnosis not present

## 2017-08-19 DIAGNOSIS — M2041 Other hammer toe(s) (acquired), right foot: Secondary | ICD-10-CM | POA: Diagnosis not present

## 2017-08-19 DIAGNOSIS — M7750 Other enthesopathy of unspecified foot: Secondary | ICD-10-CM

## 2017-08-19 DIAGNOSIS — M7751 Other enthesopathy of right foot: Secondary | ICD-10-CM | POA: Diagnosis not present

## 2017-08-19 NOTE — Progress Notes (Signed)
Subjective:   Patient ID: Brenda Townsend, female   DOB: 47 y.o.   MRN: [id number]   HPI Patient presents with painful bunion deformity right and hammertoe deformity fifth digit right with structural deformity left is not as painful.  Has tried wider shoes has tried padding without relief of symptoms and is gradually becoming more of an issue for the patient.  Patient does not smoke and likes to be active   Review of Systems  All other systems reviewed and are negative.       Objective:  Physical Exam  Constitutional: She appears well-developed and well-nourished.  Cardiovascular: Intact distal pulses.  Pulmonary/Chest: Effort normal.  Musculoskeletal: Normal range of motion.  Neurological: She is alert.  Skin: Skin is warm.  Nursing note and vitals reviewed.   Neurovascular status found to be intact muscle strength was adequate range of motion within normal limits with hyperostosis first metatarsal head right that upon palpation is very tender with nerve pain also noted around the joint surface.  Digital deformity fifth right over left with keratotic lesion and moderate structural deformity left is not as sore as the right foot.  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  I discussed structural bunion and I reviewed the condition with patient and explained bunion correction for the right foot and patient wants surgery understanding the procedures will be necessary.  Also wants hammertoe correction I reviewed that with her and patient will be seen back for consult 2 weeks and is tentatively scheduled for surgery in the middle of March  X-ray indicates elevation of the 1 2 intermetatarsal angle with good alignment  noted and rotation fifth digit bilateral

## 2017-08-19 NOTE — Progress Notes (Signed)
   Subjective:    Patient ID: Brenda Townsend, female    DOB: [birth year], 47 y.o.   MRN: [id number]  HPI    Review of Systems  All other systems reviewed and are negative.      Objective:   Physical Exam        Assessment & Plan:

## 2017-08-19 NOTE — Patient Instructions (Signed)

## 2017-08-20 ENCOUNTER — Telehealth: Payer: Self-pay | Admitting: *Deleted

## 2017-08-20 NOTE — Telephone Encounter (Signed)
I need to reschedule my surgery.  I'm scheduled for 09/15/2017.  I'd like to do it on March 26."  March 26 is not available.  He can do it on April 2.  "Put me down for April 2.  However, I am going to see if I can do it on March 12.  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  I don't want to be out starting right away.  I will call you back and let you know."

## 2017-08-28 NOTE — Telephone Encounter (Signed)
"  I am scheduled for surgery on April 2nd but I'm calling to see if I can get it switched to March 26."  Let me check with Dr. Charlsie Merlesegal.  He just had a patient to reschedule from that day to another date.  So let me see if that slot will be available.  Yes, he said it's okay to reschedule you to that slot.  I will get it rescheduled.  "Great, thank you so much."

## 2017-08-31 ENCOUNTER — Ambulatory Visit: Payer: BC Managed Care – PPO | Admitting: Podiatry

## 2017-08-31 ENCOUNTER — Encounter: Payer: Self-pay | Admitting: Podiatry

## 2017-08-31 DIAGNOSIS — M2042 Other hammer toe(s) (acquired), left foot: Secondary | ICD-10-CM

## 2017-08-31 DIAGNOSIS — M2041 Other hammer toe(s) (acquired), right foot: Secondary | ICD-10-CM

## 2017-08-31 DIAGNOSIS — M21619 Bunion of unspecified foot: Secondary | ICD-10-CM

## 2017-08-31 NOTE — Patient Instructions (Signed)
Pre-Operative Instructions  Congratulations, you have decided to take an important step towards improving your quality of life.  You can be assured that the doctors and staff at [facility] will be with you every step of the way.  Here are some important things you should know:  1. Plan to be at the surgery center/hospital at least 1 (one) hour prior to your scheduled time, unless otherwise directed by the surgical center/hospital staff.  You must have a responsible adult accompany you, remain during the surgery and drive you home.  Make sure you have directions to the surgical center/hospital to ensure you arrive on time. 2. If you are having surgery at [facility] or [facility] hospitals, you will need a copy of your medical history and physical form from your family physician within one month prior to the date of surgery. We will give you a form for your primary physician to complete.  3. We make every effort to accommodate the date you request for surgery.  However, there are times where surgery dates or times have to be moved.  We will contact you as soon as possible if a change in schedule is required.   4. No aspirin/ibuprofen for one week before surgery.  If you are on aspirin, any non-steroidal anti-inflammatory medications (Mobic, Aleve, Ibuprofen) should not be taken seven (7) days prior to your surgery.  You make take Tylenol for pain prior to surgery.  5. Medications - If you are taking daily heart and blood pressure medications, seizure, reflux, allergy, asthma, anxiety, pain or diabetes medications, make sure you notify the surgery center/hospital before the day of surgery so they can tell you which medications you should take or avoid the day of surgery. 6. No food or drink after midnight the night before surgery unless directed otherwise by surgical center/hospital staff. 7. No alcoholic beverages 24-hours prior to surgery.  No smoking 24-hours prior or 24-hours after  surgery. 8. Wear loose pants or shorts. They should be loose enough to fit over bandages, boots, and casts. 9. Don't wear slip-on shoes. Sneakers are preferred. 10. Bring your boot with you to the surgery center/hospital.  Also bring crutches or a walker if your physician has prescribed it for you.  If you do not have this equipment, it will be provided for you after surgery. 11. If you have not been contacted by the surgery center/hospital by the day before your surgery, call to confirm the date and time of your surgery. 12. Leave-time from work may vary depending on the type of surgery you have.  Appropriate arrangements should be made prior to surgery with your employer. 13. Prescriptions will be provided immediately following surgery by your doctor.  Fill these as soon as possible after surgery and take the medication as directed. Pain medications will not be refilled on weekends and must be approved by the doctor. 14. Remove nail polish on the operative foot and avoid getting pedicures prior to surgery. 15. Wash the night before surgery.  The night before surgery wash the foot and leg well with water and the antibacterial soap provided. Be sure to pay special attention to beneath the toenails and in between the toes.  Wash for at least three (3) minutes. Rinse thoroughly with water and dry well with a towel.  Perform this wash unless told not to do so by your physician.  Enclosed: 1 Ice pack (please put in freezer the night before surgery)   1 Hibiclens skin cleaner     Pre-op instructions  If you have any questions regarding the instructions, please do not hesitate to call our office.  [place]: [address], [place], [place] [address] -- [phone]  [place]: [address] [place], [place] [address] -- [phone]  [place]: [address]  [place], [place] [address] -- [phone]  [place]: [address], [address], [place], [place] [address] -- [phone]  Website:  [email or web address] 

## 2017-09-01 NOTE — Progress Notes (Signed)
Subjective:   Patient ID: Brenda Townsend, female   DOB: 47 y.o.   MRN: [id number]   HPI Patient presents with chronic bunion deformity bilateral and digital deformities with pain.  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  Patient wants surgery understanding the procedures to be performed and at this point I allowed her to read consent form going over alternative treatments and complications.  Patient is willing to accept risk of surgery understands that total recovery will take 6 months to 1 year and air fracture walker dispensed with all instructions for postoperative usage.  Scheduled for surgery and encouraged to call with questions prior to procedure

## 2017-09-03 ENCOUNTER — Ambulatory Visit: Payer: BC Managed Care – PPO | Admitting: Podiatry

## 2017-09-25 DIAGNOSIS — M79676 Pain in unspecified toe(s): Secondary | ICD-10-CM

## 2017-09-29 ENCOUNTER — Encounter: Payer: Self-pay | Admitting: Podiatry

## 2017-09-29 DIAGNOSIS — M2011 Hallux valgus (acquired), right foot: Secondary | ICD-10-CM | POA: Diagnosis not present

## 2017-09-29 DIAGNOSIS — M2041 Other hammer toe(s) (acquired), right foot: Secondary | ICD-10-CM | POA: Diagnosis not present

## 2017-10-01 ENCOUNTER — Telehealth: Payer: Self-pay | Admitting: *Deleted

## 2017-10-01 NOTE — Telephone Encounter (Signed)
"  Could you give me a call."  I'm returning your call.  How can I help you?  "I am scheduled for an appointment on the 17th.  I need to change that."  Is it a post-op appointment?  "Yes, it is."  I'm going to transfer you to a scheduler.  I only schedule surgeries.  "Oh okay, thank you."  [facility].

## 2017-10-07 ENCOUNTER — Ambulatory Visit (INDEPENDENT_AMBULATORY_CARE_PROVIDER_SITE_OTHER): Payer: BC Managed Care – PPO | Admitting: Podiatry

## 2017-10-07 ENCOUNTER — Ambulatory Visit (INDEPENDENT_AMBULATORY_CARE_PROVIDER_SITE_OTHER): Payer: BC Managed Care – PPO

## 2017-10-07 VITALS — BP 122/74 | Temp 98.0°F

## 2017-10-07 DIAGNOSIS — M2041 Other hammer toe(s) (acquired), right foot: Secondary | ICD-10-CM

## 2017-10-07 DIAGNOSIS — M21619 Bunion of unspecified foot: Secondary | ICD-10-CM

## 2017-10-07 DIAGNOSIS — M2042 Other hammer toe(s) (acquired), left foot: Secondary | ICD-10-CM

## 2017-10-07 NOTE — Progress Notes (Signed)
Subjective:   Patient ID: Brenda Townsend, female   DOB: 47 y.o.   MRN: [id number]   HPI Patient presents stating doing well with surgery with minimal discomfort and able to walk without pain   ROS      Objective:  Physical Exam  Neurovascular status intact with patient's right foot healing well with wound edges well coapted stitches in place and first metatarsal in good alignment     Assessment:  Doing well post osteotomy right first metatarsal arthroplasty fifth digit     Plan:  H&P condition reviewed and went ahead today and reapplied sterile dressing continue immobilization elevation compression and reappoint in 2 weeks for stitch removal and for further check.  Patient will be seen back earlier if any issues were to occur  X-ray indicates osteotomy is healing well good alignment joint congruous

## 2017-10-14 ENCOUNTER — Telehealth: Payer: Self-pay | Admitting: *Deleted

## 2017-10-14 NOTE — Telephone Encounter (Signed)
Pt asked if she needed to stay out of work until the sutures were removed. I told her that would not be a bad idea, it would allow her to see how her foot was as her activity increased and she would be able to get more instructions from the nurse and DR. Regal at the appt. Pt states understanding.

## 2017-10-14 NOTE — Telephone Encounter (Signed)
Pt called left name and phone number. 

## 2017-10-20 ENCOUNTER — Telehealth: Payer: Self-pay | Admitting: Podiatry

## 2017-10-20 NOTE — Telephone Encounter (Signed)
This is Brenda Townsend. My number is [phone]. Thank you.

## 2017-10-20 NOTE — Telephone Encounter (Signed)
This is Brenda Townsend. If you could please give me a call back at [phone]. Thank you.

## 2017-10-20 NOTE — Telephone Encounter (Signed)
Pt asked if she could drive after getting her sutures removed. I told pt that she could drive, but she would still bee in some type of surgery shoe, and it would be a different sensation. Pt states understanding.

## 2017-10-21 ENCOUNTER — Ambulatory Visit (INDEPENDENT_AMBULATORY_CARE_PROVIDER_SITE_OTHER): Payer: BC Managed Care – PPO | Admitting: Podiatry

## 2017-10-21 ENCOUNTER — Ambulatory Visit (INDEPENDENT_AMBULATORY_CARE_PROVIDER_SITE_OTHER): Payer: BC Managed Care – PPO

## 2017-10-21 ENCOUNTER — Encounter: Payer: Self-pay | Admitting: Podiatry

## 2017-10-21 DIAGNOSIS — M2041 Other hammer toe(s) (acquired), right foot: Secondary | ICD-10-CM

## 2017-10-21 DIAGNOSIS — M2042 Other hammer toe(s) (acquired), left foot: Secondary | ICD-10-CM

## 2017-10-21 DIAGNOSIS — M21619 Bunion of unspecified foot: Secondary | ICD-10-CM

## 2017-10-21 NOTE — Progress Notes (Signed)
Subjective:   Patient ID: Brenda Townsend, female   DOB: 47 y.o.   MRN: [id number]   HPI Patient states doing really well with the right foot with minimal discomfort wound edges well coapted and able to walk without pain   ROS      Objective:  Physical Exam  Neurovascular status intact patient was noted to have good healing incision site right first metatarsal fifth digit with wound edges well coapted good alignment and good structural correction     Assessment:  Doing well post osteotomy first metatarsal right arthroplasty fifth digit right     Plan:  H&P condition reviewed stitches removed right fifth digit compression stocking applied with continued bandage usage.  Instructed on range of motion and patient will be seen back for us to reevaluate  X-ray indicates osteotomy is healing well good alignment noted fixation in place with good reduction of the intermetatarsal angle

## 2017-10-27 ENCOUNTER — Encounter: Payer: Self-pay | Admitting: *Deleted

## 2017-10-27 ENCOUNTER — Telehealth: Payer: Self-pay | Admitting: Podiatry

## 2017-10-27 NOTE — Telephone Encounter (Signed)
Patient called and talked to Dr Charlsie Merlesegal and he released her to go back to work tomorrow 10/28/17 patient wants to know if she needs to do a half day or full day? If you can call her back at [phone]

## 2017-10-27 NOTE — Telephone Encounter (Signed)
I told pt if she wanted Dr. Charlsie Merlesegal would write for her to have 7 days of 1/2 day duty and then full days after. Pt asked if the letter could be faxed to [phone] Attn:  HR. Faxed letter to Attn:  HR.

## 2017-11-02 ENCOUNTER — Encounter: Payer: Self-pay | Admitting: *Deleted

## 2017-11-02 ENCOUNTER — Telehealth: Payer: Self-pay | Admitting: Podiatry

## 2017-11-02 NOTE — Telephone Encounter (Signed)
Pt states she is on half days but has not always been able to leave right at 12:00pm and HR wants a note stating flexible hours depending on workload. Pt would like letter emailed to [email or web address].

## 2017-11-02 NOTE — Telephone Encounter (Signed)
This is Brenda Townsend. Could you please give me a call back at [phone]. Thank you.

## 2017-11-02 NOTE — Telephone Encounter (Signed)
Done

## 2017-11-20 ENCOUNTER — Ambulatory Visit (INDEPENDENT_AMBULATORY_CARE_PROVIDER_SITE_OTHER): Payer: BC Managed Care – PPO | Admitting: Podiatry

## 2017-11-20 ENCOUNTER — Encounter: Payer: Self-pay | Admitting: Podiatry

## 2017-11-20 ENCOUNTER — Ambulatory Visit (INDEPENDENT_AMBULATORY_CARE_PROVIDER_SITE_OTHER): Payer: BC Managed Care – PPO

## 2017-11-20 ENCOUNTER — Other Ambulatory Visit: Payer: BC Managed Care – PPO

## 2017-11-20 DIAGNOSIS — M2041 Other hammer toe(s) (acquired), right foot: Secondary | ICD-10-CM | POA: Diagnosis not present

## 2017-11-20 NOTE — Progress Notes (Signed)
Subjective:   Patient ID: Brenda Townsend, female   DOB: 47 y.o.   MRN: [id number]   HPI Patient presents stating doing well with minimal discomfort and states that still gets some swelling at the end of the day   ROS      Objective:  Physical Exam  Neurovascular status intact with excellent healing osteotomy site first metatarsal right fifth digit with good alignment noted wound edges well coapted and good range of motion     Assessment:  Doing well post osteotomy arthroplasty right foot     Plan:  Reviewed x-ray and advised patient to gradually increase activity levels but still to be a little bit careful with this.  Patient will be seen back 6 weeks or earlier if needed  X-ray indicates that there is satisfactory resection of bone and excellent alignment first metatarsal with fixation in place and good healing occurring

## 2017-11-23 ENCOUNTER — Other Ambulatory Visit: Payer: BC Managed Care – PPO

## 2017-12-16 ENCOUNTER — Telehealth: Payer: Self-pay | Admitting: Podiatry

## 2017-12-16 NOTE — Telephone Encounter (Signed)
I informed pt she should discuss her driving rules with Dr. Charlsie Merlesegal 12/28/2017.

## 2017-12-16 NOTE — Telephone Encounter (Signed)
Patient had surgery on 09/29/17 she would like to know if she can drive long distance or does she need to wait until after her appointment on 12/28/17. Please give patient a call back at [phone]

## 2017-12-28 ENCOUNTER — Encounter: Payer: Self-pay | Admitting: Podiatry

## 2017-12-28 ENCOUNTER — Ambulatory Visit (INDEPENDENT_AMBULATORY_CARE_PROVIDER_SITE_OTHER): Payer: BC Managed Care – PPO | Admitting: Podiatry

## 2017-12-28 ENCOUNTER — Ambulatory Visit (INDEPENDENT_AMBULATORY_CARE_PROVIDER_SITE_OTHER): Payer: BC Managed Care – PPO

## 2017-12-28 DIAGNOSIS — M2011 Hallux valgus (acquired), right foot: Secondary | ICD-10-CM

## 2017-12-28 NOTE — Progress Notes (Signed)
Subjective:   Patient ID: Brenda Townsend, female   DOB: 47 y.o.   MRN: [id number]   HPI Patient presents stating I am doing well with just discomfort occasionally but overall doing very well with pain   ROS      Objective:  Physical Exam  Neurovascular status intact with patient's right foot healing very well from osteotomy surgery performed 3 months ago with good alignment of the first metatarsal good range of motion with no crepitus of the joint     Assessment:  Doing well post foot surgery right     Plan:  H&P condition reviewed and recommended continued range of motion exercises gradual increase in shoe gear and activities.  Patient be seen back on an as-needed basis  X-ray indicates the osteotomy is healing very well with fixation in place joint congruence

## 2018-01-01 ENCOUNTER — Ambulatory Visit: Payer: BC Managed Care – PPO | Admitting: Podiatry

## 2018-06-22 IMAGING — DX DG FOOT COMPLETE 3+V*R*
3 series · 3 of 3 positions shown · non-contrast
Comparison: None.

CLINICAL DATA: Right foot pain at the first MTP joint.

EXAM:
RIGHT FOOT COMPLETE - 3+ VIEW

[dg foot complete right (1 of 3)]
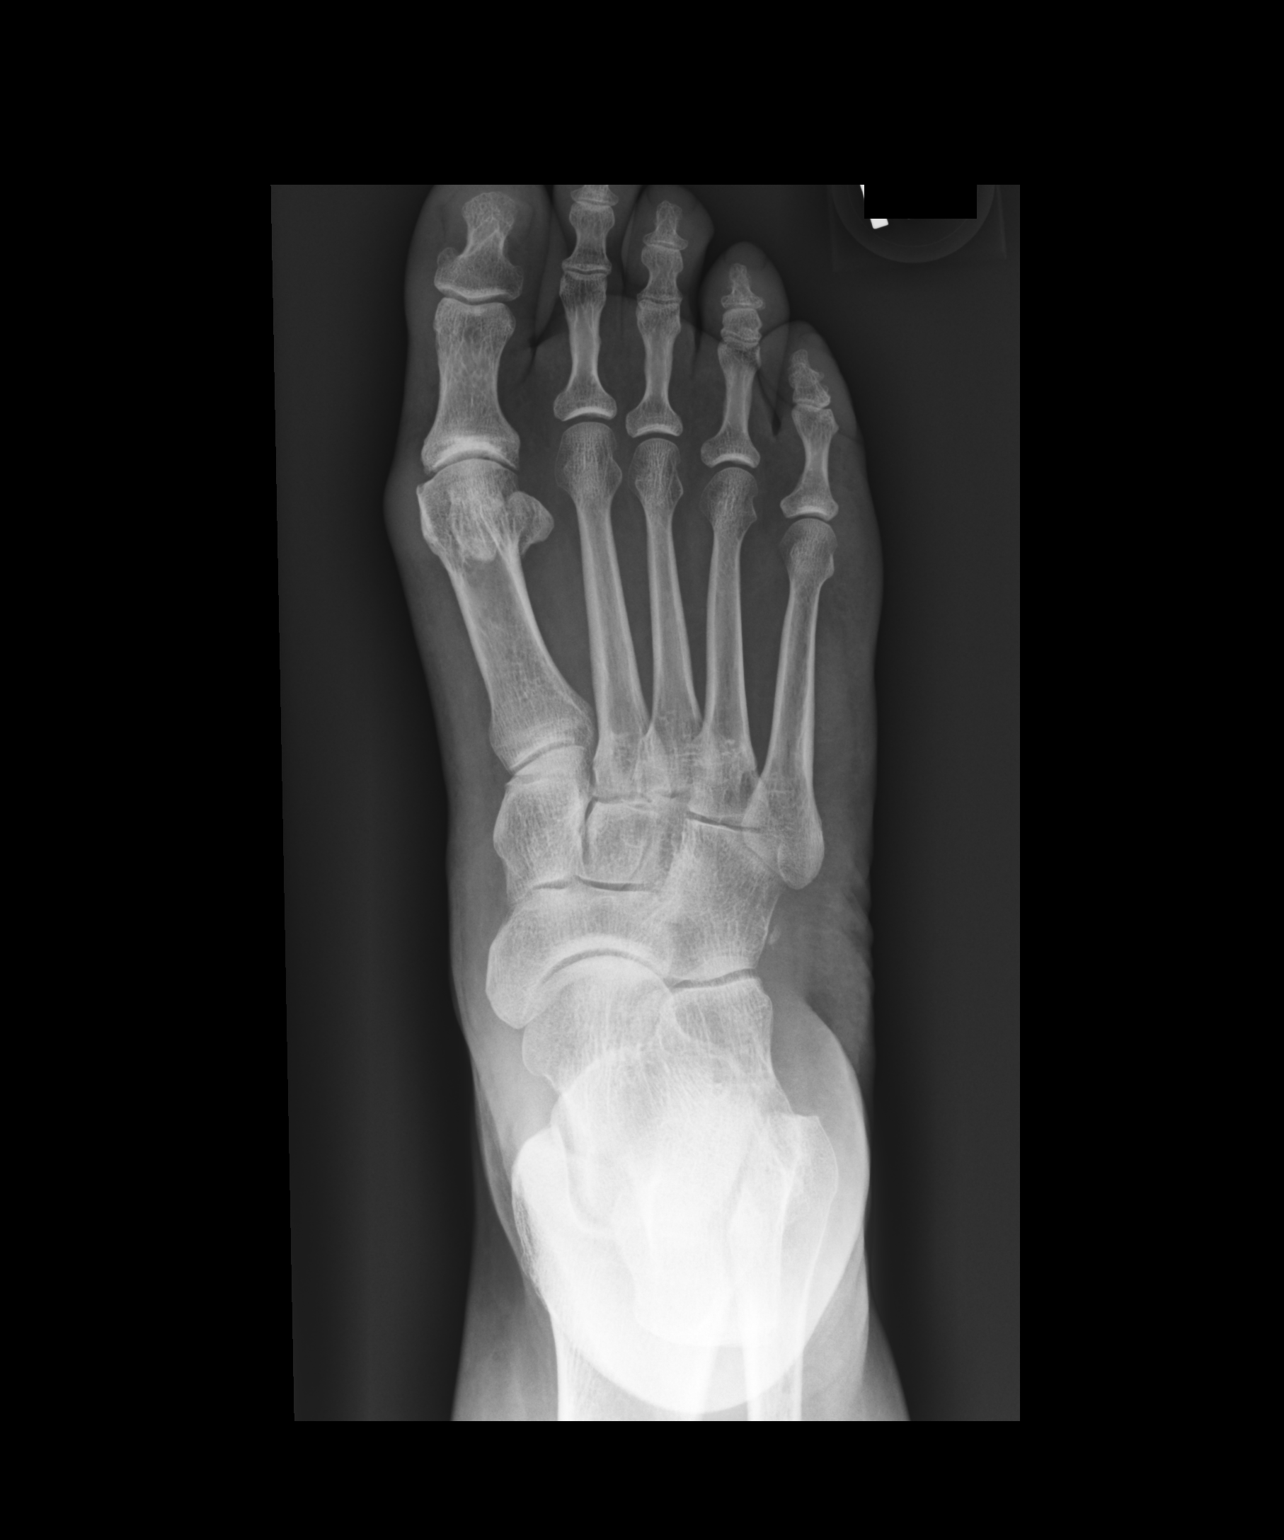

[dg foot complete right (2 of 3)]
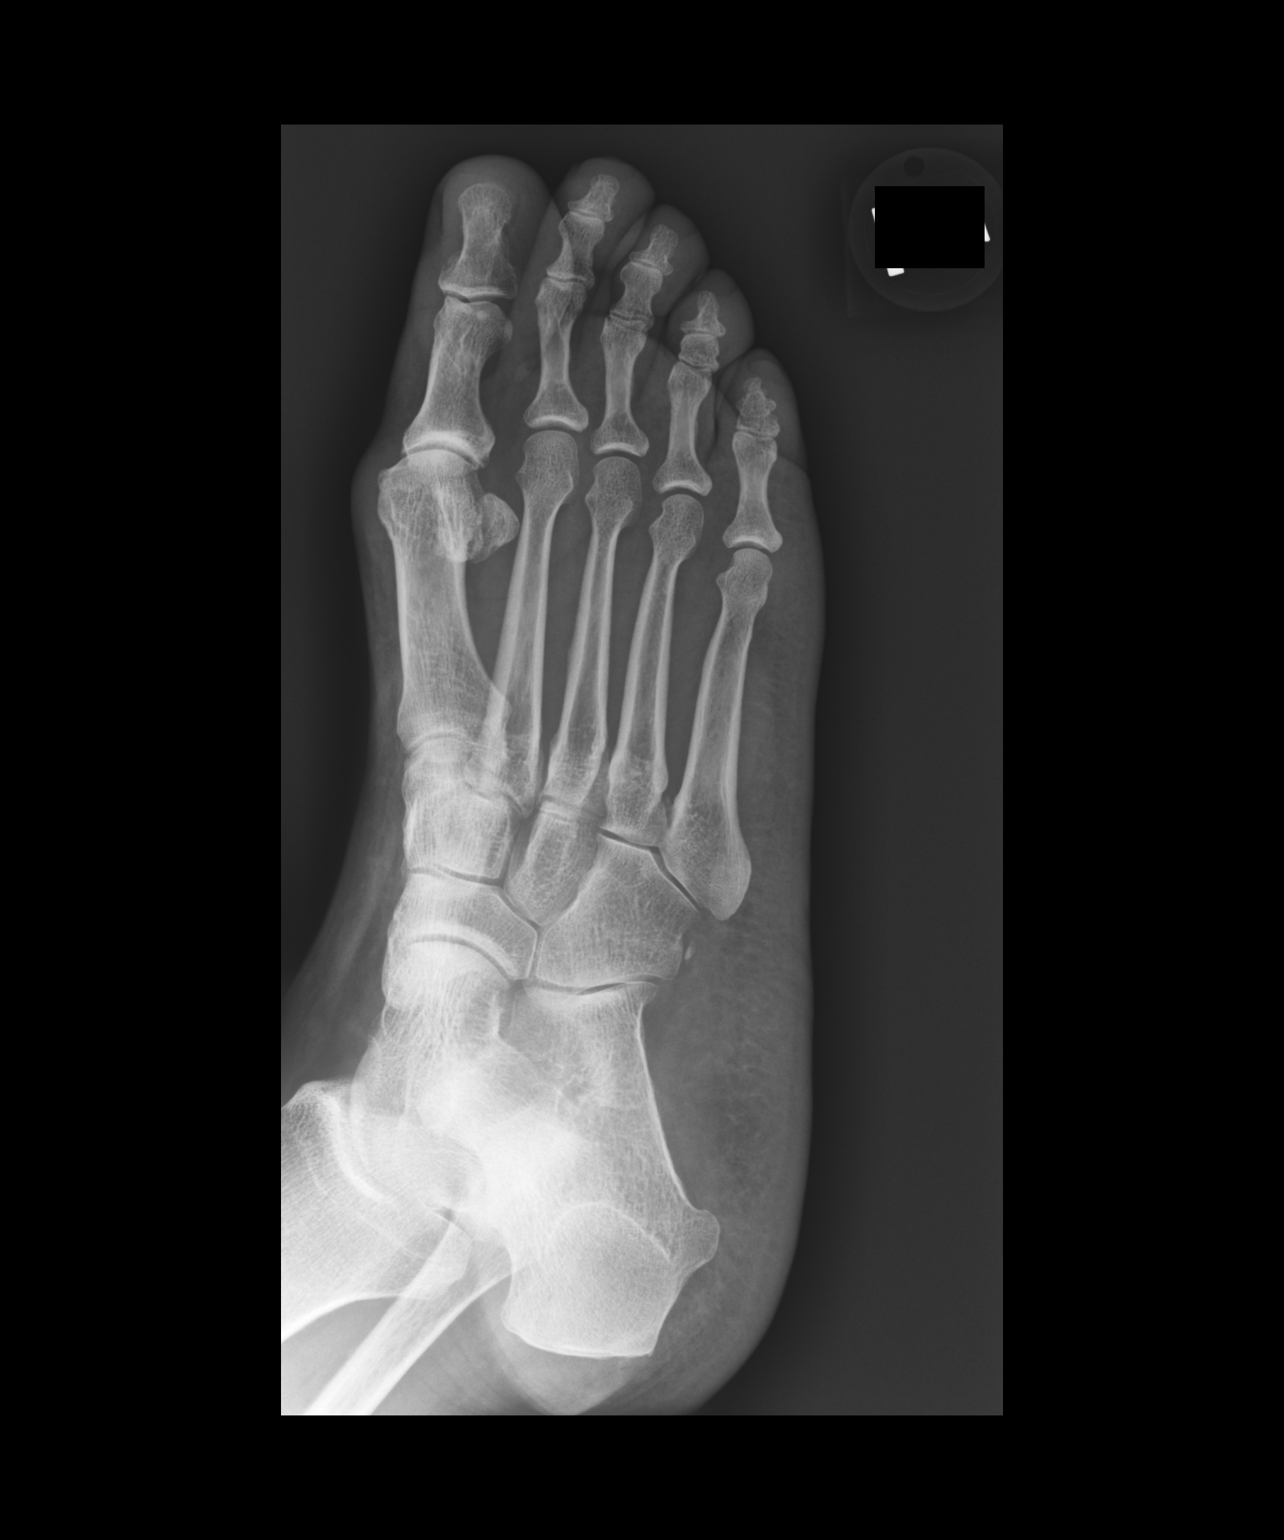

[dg foot complete right (3 of 3)]
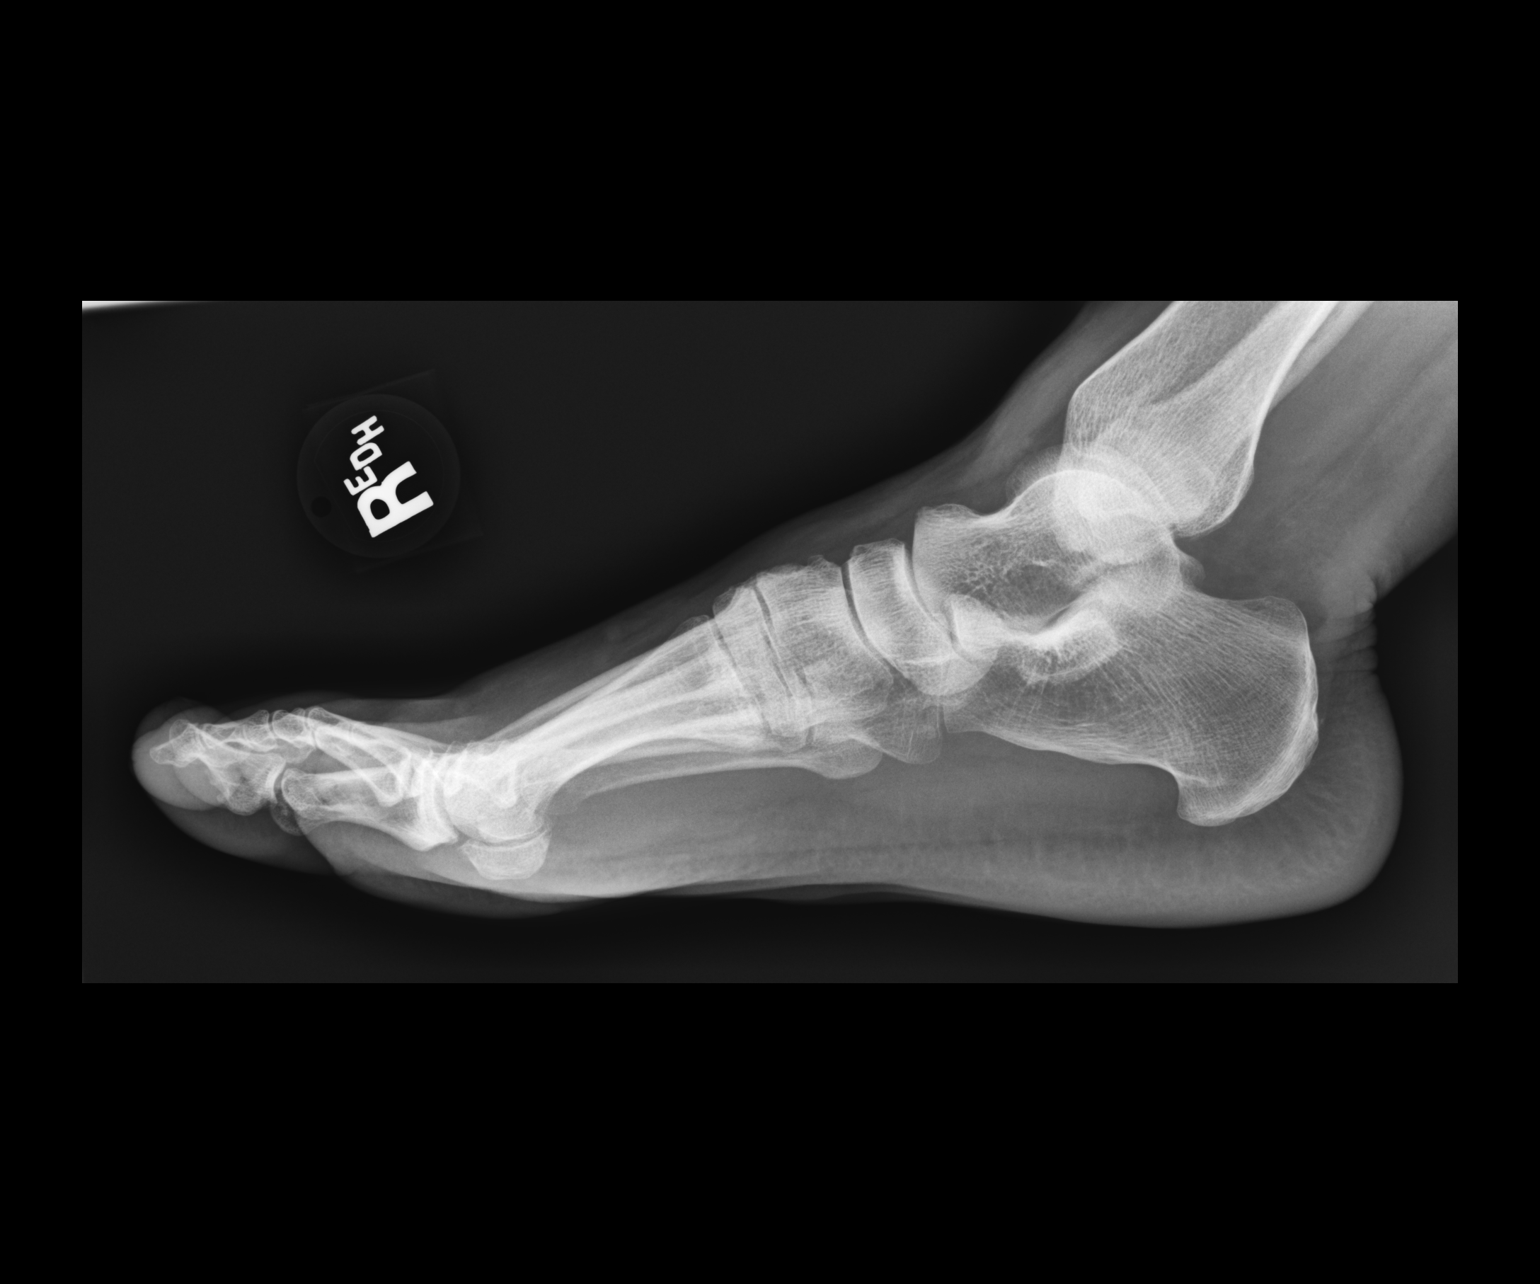

[3 of 3 positions shown; findings below may reference images not displayed]

FINDINGS: Mild sclerosis, osteophytosis and hallux valgus deformity in the
first metatarsophalangeal joint. Osseous structures are otherwise
unremarkable.
IMPRESSION: Mild first metatarsal phalangeal joint osteoarthritis.

## 2018-07-23 ENCOUNTER — Other Ambulatory Visit (HOSPITAL_COMMUNITY): Payer: Self-pay | Admitting: Nephrology

## 2018-07-23 ENCOUNTER — Other Ambulatory Visit: Payer: Self-pay | Admitting: Diagnostic Radiology

## 2018-07-23 ENCOUNTER — Other Ambulatory Visit: Payer: Self-pay | Admitting: Nephrology

## 2019-09-08 ENCOUNTER — Ambulatory Visit: Payer: BC Managed Care – PPO | Attending: Family

## 2019-09-08 ENCOUNTER — Other Ambulatory Visit: Payer: Self-pay

## 2019-09-08 DIAGNOSIS — Z23 Encounter for immunization: Secondary | ICD-10-CM | POA: Insufficient documentation

## 2019-09-08 NOTE — Progress Notes (Signed)
   Covid-19 Vaccination Clinic  Name:  Brenda Townsend    MRN: [id number] DOB: 1970-07-27  09/08/2019  [name] was observed post Covid-19 immunization for 15 minutes without incident. She was provided with Vaccine Information Sheet and instruction to access the V-Safe system.   [name] was instructed to call 911 with any severe reactions post vaccine: Marland Kitchen Difficulty breathing  . Swelling of face and throat  . A fast heartbeat  . A bad rash all over body  . Dizziness and weakness   Immunizations Administered    Name Date Dose VIS Date Route   Moderna COVID-19 Vaccine 09/08/2019 12:35 PM 0.5 mL 06/07/2019 Intramuscular   Manufacturer: Moderna   Lot: [id number]   NDC: 81856-314-97

## 2019-10-11 ENCOUNTER — Ambulatory Visit: Payer: BC Managed Care – PPO | Attending: Family

## 2019-10-11 DIAGNOSIS — Z23 Encounter for immunization: Secondary | ICD-10-CM

## 2019-10-11 NOTE — Progress Notes (Signed)
   Covid-19 Vaccination Clinic  Name:  Raylei Losurdo    MRN: [id number] DOB: [birth year]  10/11/2019  Ms. Sawka was observed post Covid-19 immunization for 15 minutes without incident. She was provided with Vaccine Information Sheet and instruction to access the V-Safe system.   Ms. Spargur was instructed to call 911 with any severe reactions post vaccine: Marland Kitchen Difficulty breathing  . Swelling of face and throat  . A fast heartbeat  . A bad rash all over body  . Dizziness and weakness   Immunizations Administered    Name Date Dose VIS Date Route   Moderna COVID-19 Vaccine 10/11/2019 12:40 PM 0.5 mL 06/07/2019 Intramuscular   Manufacturer: Moderna   Lot: [id number]   NDC: 06770-340-35

## 2020-05-01 ENCOUNTER — Ambulatory Visit: Payer: BC Managed Care – PPO | Attending: Internal Medicine

## 2020-05-01 DIAGNOSIS — Z23 Encounter for immunization: Secondary | ICD-10-CM

## 2020-05-01 NOTE — Progress Notes (Signed)
   Covid-19 Vaccination Clinic  Name:  Heiress Williamson    MRN: [id number] DOB: Dec 15, 1970  05/01/2020  Ms. Kohler was observed post Covid-19 immunization for 15 minutes without incident. She was provided with Vaccine Information Sheet and instruction to access the V-Safe system.   Ms. Lingo was instructed to call 911 with any severe reactions post vaccine: Marland Kitchen Difficulty breathing  . Swelling of face and throat  . A fast heartbeat  . A bad rash all over body  . Dizziness and weakness

## 2020-07-18 ENCOUNTER — Other Ambulatory Visit: Payer: BC Managed Care – PPO

## 2021-04-16 ENCOUNTER — Ambulatory Visit: Payer: Self-pay | Attending: Family

## 2021-04-16 DIAGNOSIS — Z23 Encounter for immunization: Secondary | ICD-10-CM

## 2021-04-16 NOTE — Progress Notes (Signed)
   Covid-19 Vaccination Clinic  Name:  Brenda Townsend    MRN: [id number] DOB: 1971/02/01  04/16/2021  [name] was observed post Covid-19 immunization for 15 minutes without incident. She was provided with Vaccine Information Sheet and instruction to access the V-Safe system.   [name] was instructed to call 911 with any severe reactions post vaccine: Difficulty breathing  Swelling of face and throat  A fast heartbeat  A bad rash all over body  Dizziness and weakness

## 2021-12-04 ENCOUNTER — Ambulatory Visit: Payer: Self-pay | Attending: Family

## 2021-12-04 DIAGNOSIS — Z23 Encounter for immunization: Secondary | ICD-10-CM

## 2021-12-04 NOTE — Progress Notes (Signed)
   Covid-19 Vaccination Clinic  Name:  Brenda Townsend    MRN: [id number] DOB: [birth year]  12/04/2021  Brenda Townsend was observed post Covid-19 immunization for 15 minutes without incident. She was provided with Vaccine Information Sheet and instruction to access the V-Safe system.   Brenda Townsend was instructed to call 911 with any severe reactions post vaccine: Difficulty breathing  Swelling of face and throat  A fast heartbeat  A bad rash all over body  Dizziness and weakness   Immunizations Administered     Name Date Dose VIS Date Route   Moderna Covid-19 vaccine Bivalent Booster 12/04/2021 10:30 AM 0.5 mL 02/16/2021 Intramuscular   Manufacturer: Moderna   Lot: [id number]   NDC: 19509-326-71

## 2022-06-12 ENCOUNTER — Encounter: Payer: Self-pay | Admitting: Podiatry

## 2022-06-12 ENCOUNTER — Ambulatory Visit: Payer: BC Managed Care – PPO | Admitting: Podiatry

## 2022-06-12 ENCOUNTER — Ambulatory Visit (INDEPENDENT_AMBULATORY_CARE_PROVIDER_SITE_OTHER): Payer: BC Managed Care – PPO

## 2022-06-12 VITALS — BP 105/75

## 2022-06-12 DIAGNOSIS — M21611 Bunion of right foot: Secondary | ICD-10-CM | POA: Diagnosis not present

## 2022-06-12 DIAGNOSIS — M21619 Bunion of unspecified foot: Secondary | ICD-10-CM

## 2022-06-12 DIAGNOSIS — M21612 Bunion of left foot: Secondary | ICD-10-CM

## 2022-06-12 NOTE — Progress Notes (Signed)
Subjective:   Patient ID: Brenda Townsend, female   DOB: 51 y.o.   MRN: [id number]   HPI Patient presents stating the left bunion is starting to bother her more and she rather go ahead and get it corrected.  States she has tried wider shoes she has tried padding the area she has tried soaks and she had surgery on her right one 4 years ago that did beautifully   Review of Systems  All other systems reviewed and are negative.       Objective:  Physical Exam Vitals and nursing note reviewed.  Constitutional:      Appearance: She is well-developed.  Pulmonary:     Effort: Pulmonary effort is normal.  Musculoskeletal:        General: Normal range of motion.  Skin:    General: Skin is warm.  Neurological:     Mental Status: She is alert.     Neuro vas alert status intact with patient found to have good range of motion no history of change in health at all with patient found to have prominent first metatarsal head left painful when pressed red around the side with a good healing alignment of the first MPJ right with good range of motion     Assessment:  Symptomatic structural HAV deformity left it is not responded conservatively with history of correction right that is done beautifully     Plan:  H&P reviewed conditions at great length and I do think distal osteotomy would be of great benefit to her.  I reviewed her x-rays and at this point she wants surgery in January and I allowed her to read consent form going over alternative treatments complications and there is no long-term guarantees and the total recovery can take 6 months to 1 year.  Patient scheduled outpatient surgery and today I did dispense air fracture walker to hold her foot in a stable position after the procedure is done and I educated her on that fact  X-rays indicate elevation of the 1 2 intermetatarsal angle left of approximate 15 degrees mild deviation of the hallux with well-healed surgical site right first MPJ  fixation in place good alignment

## 2022-07-14 ENCOUNTER — Telehealth: Payer: Self-pay | Admitting: Podiatry

## 2022-07-14 NOTE — Telephone Encounter (Signed)
DOS: 08/12/2022  [employer] [place] Effective 07/07/2022  [place] [facility] [phone])  Deductible: $1,250 with $0 met Out-of-Pocket: $4,890 with $0 met CoInsurance: 20%  Prior authorization is not required per Nadi R. Call Reference #: [id number]

## 2022-07-24 ENCOUNTER — Ambulatory Visit: Payer: BC Managed Care – PPO | Admitting: Podiatry

## 2022-08-11 MED ORDER — ONDANSETRON HCL 4 MG PO TABS: 4.0000 mg | ORAL_TABLET | Freq: Three times a day (TID) | ORAL | 0 refills | Status: AC | PRN

## 2022-08-11 MED ORDER — OXYCODONE-ACETAMINOPHEN 10-325 MG PO TABS: 1.0000 | ORAL_TABLET | ORAL | 0 refills | Status: AC | PRN

## 2022-08-11 NOTE — Addendum Note (Signed)
Addended by: Wallene Huh on: 08/11/2022 04:52 PM   Modules accepted: Orders

## 2022-08-12 DIAGNOSIS — M2012 Hallux valgus (acquired), left foot: Secondary | ICD-10-CM | POA: Diagnosis not present

## 2022-08-18 ENCOUNTER — Encounter: Payer: Self-pay | Admitting: Podiatry

## 2022-08-18 ENCOUNTER — Ambulatory Visit (INDEPENDENT_AMBULATORY_CARE_PROVIDER_SITE_OTHER): Payer: BC Managed Care – PPO

## 2022-08-18 VITALS — BP 150/91 | HR 69

## 2022-08-18 DIAGNOSIS — M21612 Bunion of left foot: Secondary | ICD-10-CM

## 2022-08-18 DIAGNOSIS — M21619 Bunion of unspecified foot: Secondary | ICD-10-CM

## 2022-08-18 NOTE — Progress Notes (Signed)
Patient presents today for post op visit # 1 , patient of Dr. Paulla Dolly   POV #1 DOS 08/12/22 [place] Left    Patient presents in walking boot. Denies any falls or injury to the foot. Foot is slightly swollen. No signs of infection. No calf pain or shortness of breath. Bandages dry and intact. Incision is intact.  Advised patient she can get incision wet at this point. Patient verbalized understanding.   BP: 150/91 P: 69     Xrays taken today and reviewed by Dr. Paulla Dolly. He did take a look at the foot today as well.   Foot redressed today and placed back in the boot. Post-op shoe and Ace wrap dispensed today. Reviewed icing and elevation. Patient will follow up with Dr. Paulla Dolly for POV# 2 in 4 weeks.

## 2022-08-22 ENCOUNTER — Telehealth: Payer: Self-pay | Admitting: *Deleted

## 2022-08-22 NOTE — Telephone Encounter (Signed)
Patient is calling to ask if she can work remote from home next week, if approved please email:[email or web address].

## 2022-08-22 NOTE — Telephone Encounter (Signed)
That is fine 

## 2022-08-25 ENCOUNTER — Telehealth: Payer: Self-pay | Admitting: Podiatry

## 2022-08-25 NOTE — Telephone Encounter (Signed)
Left message to speak with pt. About letter needed to return to remote work next week. I left my phone number for her to call me back.

## 2022-08-25 NOTE — Telephone Encounter (Signed)
I spoke with Brenda Townsend about returning to remote work, because It was ok'ed by Dr. Paulla Dolly. The patient said that she received a letter last week and returned to the office today. She said that she received the letter from the front desk, she had surgery on 08/14/2022. She said that she is keeping her foot propped up at work under her desk with some boxes and she feels fine. No problems at all. I told her to call the office if she has any problems but to continue to keep her foot propped up. She said when she gets home iher foot is elevated all night.

## 2022-08-29 ENCOUNTER — Other Ambulatory Visit: Payer: BC Managed Care – PPO

## 2022-09-15 ENCOUNTER — Ambulatory Visit (INDEPENDENT_AMBULATORY_CARE_PROVIDER_SITE_OTHER): Payer: BC Managed Care – PPO | Admitting: Podiatry

## 2022-09-15 ENCOUNTER — Ambulatory Visit (INDEPENDENT_AMBULATORY_CARE_PROVIDER_SITE_OTHER): Payer: BC Managed Care – PPO

## 2022-09-15 ENCOUNTER — Encounter: Payer: Self-pay | Admitting: Podiatry

## 2022-09-15 DIAGNOSIS — M21619 Bunion of unspecified foot: Secondary | ICD-10-CM | POA: Diagnosis not present

## 2022-09-15 DIAGNOSIS — M21612 Bunion of left foot: Secondary | ICD-10-CM | POA: Diagnosis not present

## 2022-09-15 NOTE — Progress Notes (Signed)
Subjective:   Patient ID: Brenda Townsend, female   DOB: 52 y.o.   MRN: [id number]   HPI Patient presents stating that she is doing very well and is ready to start wearing shoes   ROS      Objective:  Physical Exam  Neurovascular status intact negative Bevelyn Buckles' sign noted left foot healed well wound edges well coapted range of motion good no crepitus     Assessment:  Doing well post osteotomy left first MPJ     Plan:  H&P x-rays reviewed and gradual increase in activity levels discussed along with range of motion exercises and compression stocking dispensed.  Reappoint as symptoms indicate all questions answered  X-rays indicate osteotomies healing well good alignment noted

## 2022-11-10 ENCOUNTER — Telehealth (INDEPENDENT_AMBULATORY_CARE_PROVIDER_SITE_OTHER): Payer: BC Managed Care – PPO | Admitting: Podiatry

## 2022-11-10 NOTE — Telephone Encounter (Signed)
Patient s/p bunion surgery, Feb 6th. Wanted to know if it's okay for her to get a pedicure. Dr. Charlsie Merles gave permission for her to get pedicure. I phoned patient back at 12:13 pm to notify her of Dr. Beverlee Nims answer giving her permission to get a pedicure.

## 2023-10-30 ENCOUNTER — Other Ambulatory Visit: Payer: Self-pay | Admitting: Gastroenterology

## 2024-03-23 ENCOUNTER — Ambulatory Visit (HOSPITAL_COMMUNITY)
Admission: RE | Admit: 2024-03-23 | Discharge: 2024-03-23 | Disposition: A | Discharge status: Home / Self Care | Payer: Self-pay | Attending: Gastroenterology | Admitting: Gastroenterology

## 2024-03-23 ENCOUNTER — Encounter (HOSPITAL_COMMUNITY)
Admission: RE | Disposition: A | Discharge status: Home / Self Care | Payer: Self-pay | Source: Home / Self Care | Attending: Gastroenterology

## 2024-03-23 DIAGNOSIS — R159 Full incontinence of feces: Secondary | ICD-10-CM | POA: Insufficient documentation

## 2024-03-23 HISTORY — PX: ANAL RECTAL MANOMETRY: SHX6358

## 2024-03-23 SURGERY — MANOMETRY, ANORECTAL

## 2024-03-23 NOTE — Progress Notes (Signed)
 Anal manometry done per protocol. Pt tolerated well without distress or complication Willy Hummer RN in during procedure.

## 2024-03-24 ENCOUNTER — Encounter (HOSPITAL_COMMUNITY): Payer: Self-pay | Admitting: Gastroenterology
